# Patient Record
Sex: Male | Born: 1935 | Race: White | Hispanic: No | Marital: Married
Health system: Southern US, Community
[De-identification: ages and names within clinical notes are randomized; demographics above are authoritative.]

---

## 2005-02-02 ENCOUNTER — Ambulatory Visit: Payer: Self-pay | Admitting: Gastroenterology

## 2005-02-16 ENCOUNTER — Ambulatory Visit: Payer: Self-pay | Admitting: Internal Medicine

## 2005-03-06 ENCOUNTER — Ambulatory Visit: Payer: Self-pay | Admitting: Internal Medicine

## 2014-01-04 ENCOUNTER — Inpatient Hospital Stay: Payer: Self-pay | Admitting: Specialist

## 2014-01-04 LAB — CBC WITH DIFFERENTIAL/PLATELET
BASOS PCT: 0.6 %
Basophil #: 0.1 10*3/uL (ref 0.0–0.1)
EOS ABS: 0.9 10*3/uL — AB (ref 0.0–0.7)
Eosinophil %: 7 %
HCT: 48.9 % (ref 40.0–52.0)
HGB: 16.6 g/dL (ref 13.0–18.0)
LYMPHS PCT: 14.5 %
Lymphocyte #: 1.9 10*3/uL (ref 1.0–3.6)
MCH: 33.2 pg (ref 26.0–34.0)
MCHC: 34 g/dL (ref 32.0–36.0)
MCV: 98 fL (ref 80–100)
Monocyte #: 1.1 x10 3/mm — ABNORMAL HIGH (ref 0.2–1.0)
Monocyte %: 8.7 %
NEUTROS ABS: 9.1 10*3/uL — AB (ref 1.4–6.5)
Neutrophil %: 69.2 %
PLATELETS: 227 10*3/uL (ref 150–440)
RBC: 5 10*6/uL (ref 4.40–5.90)
RDW: 13.8 % (ref 11.5–14.5)
WBC: 13.1 10*3/uL — AB (ref 3.8–10.6)

## 2014-01-04 LAB — COMPREHENSIVE METABOLIC PANEL
ALK PHOS: 91 U/L
AST: 24 U/L (ref 15–37)
Albumin: 3.8 g/dL (ref 3.4–5.0)
Anion Gap: 5 — ABNORMAL LOW (ref 7–16)
BUN: 16 mg/dL (ref 7–18)
Bilirubin,Total: 0.5 mg/dL (ref 0.2–1.0)
CO2: 28 mmol/L (ref 21–32)
CREATININE: 0.97 mg/dL (ref 0.60–1.30)
Calcium, Total: 9 mg/dL (ref 8.5–10.1)
Chloride: 100 mmol/L (ref 98–107)
EGFR (African American): >60
EGFR (Non-African Amer.): >60
Glucose: 148 mg/dL — ABNORMAL HIGH (ref 65–99)
Osmolality: 270 (ref 275–301)
Potassium: 4.3 mmol/L (ref 3.5–5.1)
SGPT (ALT): 27 U/L (ref 12–78)
Sodium: 133 mmol/L — ABNORMAL LOW (ref 136–145)
Total Protein: 8.3 g/dL — ABNORMAL HIGH (ref 6.4–8.2)

## 2014-01-04 LAB — TROPONIN I

## 2014-01-05 LAB — CBC WITH DIFFERENTIAL/PLATELET
Basophil #: 0 10*3/uL (ref 0.0–0.1)
Basophil %: 0.1 %
Eosinophil #: 0 10*3/uL (ref 0.0–0.7)
Eosinophil %: 0.1 %
HCT: 46 % (ref 40.0–52.0)
HGB: 15.5 g/dL (ref 13.0–18.0)
Lymphocyte #: 0.5 10*3/uL — ABNORMAL LOW (ref 1.0–3.6)
Lymphocyte %: 6.7 %
MCH: 33 pg (ref 26.0–34.0)
MCHC: 33.8 g/dL (ref 32.0–36.0)
MCV: 98 fL (ref 80–100)
Monocyte #: 0.1 x10 3/mm — ABNORMAL LOW (ref 0.2–1.0)
Monocyte %: 0.7 %
Neutrophil #: 7 10*3/uL — ABNORMAL HIGH (ref 1.4–6.5)
Neutrophil %: 92.4 %
Platelet: 187 10*3/uL (ref 150–440)
RBC: 4.7 10*6/uL (ref 4.40–5.90)
RDW: 13.6 % (ref 11.5–14.5)
WBC: 7.6 10*3/uL (ref 3.8–10.6)

## 2014-02-01 ENCOUNTER — Observation Stay: Payer: Self-pay | Admitting: Internal Medicine

## 2014-02-01 LAB — BASIC METABOLIC PANEL
ANION GAP: 8 (ref 7–16)
BUN: 18 mg/dL (ref 7–18)
CHLORIDE: 101 mmol/L (ref 98–107)
CO2: 23 mmol/L (ref 21–32)
Calcium, Total: 8.9 mg/dL (ref 8.5–10.1)
Creatinine: 0.89 mg/dL (ref 0.60–1.30)
EGFR (African American): >60
EGFR (Non-African Amer.): >60
Glucose: 140 mg/dL — ABNORMAL HIGH (ref 65–99)
Osmolality: 269 (ref 275–301)
POTASSIUM: 4.3 mmol/L (ref 3.5–5.1)
SODIUM: 132 mmol/L — AB (ref 136–145)

## 2014-02-01 LAB — CBC
HCT: 47 % (ref 40.0–52.0)
HGB: 15.6 g/dL (ref 13.0–18.0)
MCH: 32 pg (ref 26.0–34.0)
MCHC: 33.2 g/dL (ref 32.0–36.0)
MCV: 97 fL (ref 80–100)
PLATELETS: 185 10*3/uL (ref 150–440)
RBC: 4.86 10*6/uL (ref 4.40–5.90)
RDW: 13.5 % (ref 11.5–14.5)
WBC: 7.2 10*3/uL (ref 3.8–10.6)

## 2014-02-01 LAB — TROPONIN I

## 2014-02-02 LAB — BASIC METABOLIC PANEL
ANION GAP: 7 (ref 7–16)
BUN: 19 mg/dL — ABNORMAL HIGH (ref 7–18)
CHLORIDE: 101 mmol/L (ref 98–107)
CO2: 26 mmol/L (ref 21–32)
Calcium, Total: 9.1 mg/dL (ref 8.5–10.1)
Creatinine: 1.04 mg/dL (ref 0.60–1.30)
EGFR (Non-African Amer.): >60
GLUCOSE: 172 mg/dL — AB (ref 65–99)
Osmolality: 275 (ref 275–301)
Potassium: 4.5 mmol/L (ref 3.5–5.1)
Sodium: 134 mmol/L — ABNORMAL LOW (ref 136–145)

## 2014-02-02 LAB — CBC WITH DIFFERENTIAL/PLATELET
Basophil #: 0 10*3/uL (ref 0.0–0.1)
Basophil %: 0.2 %
Eosinophil #: 0 10*3/uL (ref 0.0–0.7)
Eosinophil %: 0.2 %
HCT: 43.5 % (ref 40.0–52.0)
HGB: 14.5 g/dL (ref 13.0–18.0)
LYMPHS PCT: 13.3 %
Lymphocyte #: 0.5 10*3/uL — ABNORMAL LOW (ref 1.0–3.6)
MCH: 32.2 pg (ref 26.0–34.0)
MCHC: 33.5 g/dL (ref 32.0–36.0)
MCV: 96 fL (ref 80–100)
Monocyte #: 0.1 x10 3/mm — ABNORMAL LOW (ref 0.2–1.0)
Monocyte %: 1.9 %
NEUTROS ABS: 3.4 10*3/uL (ref 1.4–6.5)
Neutrophil %: 84.4 %
Platelet: 175 10*3/uL (ref 150–440)
RBC: 4.51 10*6/uL (ref 4.40–5.90)
RDW: 13.2 % (ref 11.5–14.5)
WBC: 4 10*3/uL (ref 3.8–10.6)

## 2014-03-01 ENCOUNTER — Inpatient Hospital Stay: Payer: Self-pay | Admitting: Internal Medicine

## 2014-03-01 LAB — CBC WITH DIFFERENTIAL/PLATELET
BASOS PCT: 0.7 %
Basophil #: 0.1 10*3/uL (ref 0.0–0.1)
EOS PCT: 10.1 %
Eosinophil #: 1.2 10*3/uL — ABNORMAL HIGH (ref 0.0–0.7)
HCT: 45.8 % (ref 40.0–52.0)
HGB: 15.7 g/dL (ref 13.0–18.0)
LYMPHS ABS: 2.2 10*3/uL (ref 1.0–3.6)
Lymphocyte %: 18.3 %
MCH: 33.1 pg (ref 26.0–34.0)
MCHC: 34.2 g/dL (ref 32.0–36.0)
MCV: 97 fL (ref 80–100)
MONOS PCT: 13.9 %
Monocyte #: 1.7 x10 3/mm — ABNORMAL HIGH (ref 0.2–1.0)
NEUTROS PCT: 57 %
Neutrophil #: 6.9 10*3/uL — ABNORMAL HIGH (ref 1.4–6.5)
Platelet: 212 10*3/uL (ref 150–440)
RBC: 4.74 10*6/uL (ref 4.40–5.90)
RDW: 13.5 % (ref 11.5–14.5)
WBC: 12.1 10*3/uL — AB (ref 3.8–10.6)

## 2014-03-01 LAB — BASIC METABOLIC PANEL
Anion Gap: 8 (ref 7–16)
BUN: 13 mg/dL (ref 7–18)
CALCIUM: 9.1 mg/dL (ref 8.5–10.1)
Chloride: 97 mmol/L — ABNORMAL LOW (ref 98–107)
Co2: 28 mmol/L (ref 21–32)
Creatinine: 0.91 mg/dL (ref 0.60–1.30)
EGFR (African American): >60
EGFR (Non-African Amer.): >60
Glucose: 138 mg/dL — ABNORMAL HIGH (ref 65–99)
Osmolality: 269 (ref 275–301)
POTASSIUM: 4.3 mmol/L (ref 3.5–5.1)
Sodium: 133 mmol/L — ABNORMAL LOW (ref 136–145)

## 2014-03-03 LAB — EXPECTORATED SPUTUM ASSESSMENT W GRAM STAIN, RFLX TO RESP C

## 2014-06-16 ENCOUNTER — Emergency Department: Payer: Self-pay | Admitting: Emergency Medicine

## 2014-06-16 LAB — COMPREHENSIVE METABOLIC PANEL
ALK PHOS: 84 U/L
Albumin: 4 g/dL (ref 3.4–5.0)
Anion Gap: 7 (ref 7–16)
BUN: 11 mg/dL (ref 7–18)
Bilirubin,Total: 0.5 mg/dL (ref 0.2–1.0)
CO2: 26 mmol/L (ref 21–32)
Calcium, Total: 8.8 mg/dL (ref 8.5–10.1)
Chloride: 103 mmol/L (ref 98–107)
Creatinine: 0.93 mg/dL (ref 0.60–1.30)
GLUCOSE: 136 mg/dL — AB (ref 65–99)
Osmolality: 273 (ref 275–301)
Potassium: 4.2 mmol/L (ref 3.5–5.1)
SGOT(AST): 23 U/L (ref 15–37)
SGPT (ALT): 28 U/L
Sodium: 136 mmol/L (ref 136–145)
Total Protein: 7.8 g/dL (ref 6.4–8.2)

## 2014-06-16 LAB — CBC
HCT: 48.1 % (ref 40.0–52.0)
HGB: 16.1 g/dL (ref 13.0–18.0)
MCH: 32.6 pg (ref 26.0–34.0)
MCHC: 33.4 g/dL (ref 32.0–36.0)
MCV: 97 fL (ref 80–100)
PLATELETS: 203 10*3/uL (ref 150–440)
RBC: 4.94 10*6/uL (ref 4.40–5.90)
RDW: 12.9 % (ref 11.5–14.5)
WBC: 9.5 10*3/uL (ref 3.8–10.6)

## 2014-06-16 LAB — TROPONIN I

## 2014-06-16 LAB — URINALYSIS, COMPLETE
BACTERIA: NONE SEEN
BLOOD: NEGATIVE
Bilirubin,UR: NEGATIVE
Glucose,UR: NEGATIVE mg/dL (ref 0–75)
Ketone: NEGATIVE
Leukocyte Esterase: NEGATIVE
NITRITE: NEGATIVE
Ph: 6 (ref 4.5–8.0)
Protein: 30
RBC,UR: 1 /HPF (ref 0–5)
SPECIFIC GRAVITY: 1.006 (ref 1.003–1.030)
WBC UR: <1 /HPF (ref 0–5)

## 2014-06-16 LAB — CK TOTAL AND CKMB (NOT AT ARMC)
CK, Total: 139 U/L
CK-MB: 3.2 ng/mL (ref 0.5–3.6)

## 2014-08-10 ENCOUNTER — Emergency Department: Payer: Self-pay | Admitting: Emergency Medicine

## 2014-08-25 ENCOUNTER — Ambulatory Visit: Payer: Self-pay | Admitting: Internal Medicine

## 2014-08-27 ENCOUNTER — Ambulatory Visit: Payer: Self-pay | Admitting: Internal Medicine

## 2014-09-08 ENCOUNTER — Inpatient Hospital Stay: Payer: Self-pay | Admitting: Internal Medicine

## 2014-09-08 LAB — COMPREHENSIVE METABOLIC PANEL
ANION GAP: 4 — AB (ref 7–16)
Albumin: 3.2 g/dL — ABNORMAL LOW (ref 3.4–5.0)
Alkaline Phosphatase: 86 U/L
BUN: 11 mg/dL (ref 7–18)
Bilirubin,Total: 0.6 mg/dL (ref 0.2–1.0)
CHLORIDE: 101 mmol/L (ref 98–107)
CO2: 31 mmol/L (ref 21–32)
Calcium, Total: 9 mg/dL (ref 8.5–10.1)
Creatinine: 0.89 mg/dL (ref 0.60–1.30)
EGFR (Non-African Amer.): >60
Glucose: 145 mg/dL — ABNORMAL HIGH (ref 65–99)
OSMOLALITY: 274 (ref 275–301)
Potassium: 4.1 mmol/L (ref 3.5–5.1)
SGOT(AST): 17 U/L (ref 15–37)
SGPT (ALT): 26 U/L
SODIUM: 136 mmol/L (ref 136–145)
Total Protein: 6.8 g/dL (ref 6.4–8.2)

## 2014-09-08 LAB — CBC WITH DIFFERENTIAL/PLATELET
BASOS ABS: 0 10*3/uL (ref 0.0–0.1)
Basophil %: 0.5 %
EOS ABS: 0.5 10*3/uL (ref 0.0–0.7)
Eosinophil %: 7.4 %
HCT: 41.2 % (ref 40.0–52.0)
HGB: 13.8 g/dL (ref 13.0–18.0)
LYMPHS PCT: 18.1 %
Lymphocyte #: 1.2 10*3/uL (ref 1.0–3.6)
MCH: 32.4 pg (ref 26.0–34.0)
MCHC: 33.5 g/dL (ref 32.0–36.0)
MCV: 97 fL (ref 80–100)
Monocyte #: 0.7 x10 3/mm (ref 0.2–1.0)
Monocyte %: 10.7 %
Neutrophil #: 4.3 10*3/uL (ref 1.4–6.5)
Neutrophil %: 63.3 %
PLATELETS: 199 10*3/uL (ref 150–440)
RBC: 4.25 10*6/uL — ABNORMAL LOW (ref 4.40–5.90)
RDW: 14.3 % (ref 11.5–14.5)
WBC: 6.7 10*3/uL (ref 3.8–10.6)

## 2014-09-08 LAB — TROPONIN I: Troponin-I: 0.03 ng/mL

## 2014-09-08 LAB — CK TOTAL AND CKMB (NOT AT ARMC)
CK, Total: 71 U/L (ref 39–308)
CK-MB: 2.2 ng/mL (ref 0.5–3.6)

## 2014-09-09 LAB — CBC WITH DIFFERENTIAL/PLATELET
BASOS ABS: 0 10*3/uL (ref 0.0–0.1)
Basophil %: 0.2 %
EOS PCT: 0.2 %
Eosinophil #: 0 10*3/uL (ref 0.0–0.7)
HCT: 38.3 % — ABNORMAL LOW (ref 40.0–52.0)
HGB: 13.1 g/dL (ref 13.0–18.0)
Lymphocyte #: 0.5 10*3/uL — ABNORMAL LOW (ref 1.0–3.6)
Lymphocyte %: 15 %
MCH: 32.6 pg (ref 26.0–34.0)
MCHC: 34.2 g/dL (ref 32.0–36.0)
MCV: 95 fL (ref 80–100)
MONO ABS: 0.1 x10 3/mm — AB (ref 0.2–1.0)
Monocyte %: 2.6 %
NEUTROS PCT: 82 %
Neutrophil #: 2.7 10*3/uL (ref 1.4–6.5)
PLATELETS: 198 10*3/uL (ref 150–440)
RBC: 4.03 10*6/uL — ABNORMAL LOW (ref 4.40–5.90)
RDW: 14 % (ref 11.5–14.5)
WBC: 3.2 10*3/uL — AB (ref 3.8–10.6)

## 2014-09-09 LAB — BASIC METABOLIC PANEL
Anion Gap: 7 (ref 7–16)
BUN: 12 mg/dL (ref 7–18)
CALCIUM: 9 mg/dL (ref 8.5–10.1)
CHLORIDE: 99 mmol/L (ref 98–107)
Co2: 27 mmol/L (ref 21–32)
Creatinine: 0.86 mg/dL (ref 0.60–1.30)
EGFR (African American): >60
EGFR (Non-African Amer.): >60
Glucose: 194 mg/dL — ABNORMAL HIGH (ref 65–99)
OSMOLALITY: 271 (ref 275–301)
Potassium: 4.4 mmol/L (ref 3.5–5.1)
Sodium: 133 mmol/L — ABNORMAL LOW (ref 136–145)

## 2014-09-12 LAB — EXPECTORATED SPUTUM ASSESSMENT W REFEX TO RESP CULTURE

## 2014-09-12 LAB — CREATININE, SERUM
Creatinine: 1 mg/dL (ref 0.60–1.30)
EGFR (African American): >60
EGFR (Non-African Amer.): >60

## 2014-09-13 LAB — CULTURE, BLOOD (SINGLE)

## 2014-09-13 LAB — PLATELET COUNT: Platelet: 236 10*3/uL (ref 150–440)

## 2014-09-14 LAB — CBC WITH DIFFERENTIAL/PLATELET
BASOS PCT: 0.1 %
Basophil #: 0 10*3/uL (ref 0.0–0.1)
EOS PCT: 0.4 %
Eosinophil #: 0 10*3/uL (ref 0.0–0.7)
HCT: 36.5 % — AB (ref 40.0–52.0)
HGB: 12.8 g/dL — AB (ref 13.0–18.0)
Lymphocyte #: 0.9 10*3/uL — ABNORMAL LOW (ref 1.0–3.6)
Lymphocyte %: 12.7 %
MCH: 33.2 pg (ref 26.0–34.0)
MCHC: 35 g/dL (ref 32.0–36.0)
MCV: 95 fL (ref 80–100)
MONO ABS: 0.8 x10 3/mm (ref 0.2–1.0)
Monocyte %: 10.9 %
NEUTROS ABS: 5.6 10*3/uL (ref 1.4–6.5)
Neutrophil %: 75.9 %
Platelet: 237 10*3/uL (ref 150–440)
RBC: 3.85 10*6/uL — ABNORMAL LOW (ref 4.40–5.90)
RDW: 14.1 % (ref 11.5–14.5)
WBC: 7.3 10*3/uL (ref 3.8–10.6)

## 2014-09-14 LAB — BASIC METABOLIC PANEL
ANION GAP: 7 (ref 7–16)
BUN: 22 mg/dL — AB (ref 7–18)
CALCIUM: 8.3 mg/dL — AB (ref 8.5–10.1)
Chloride: 97 mmol/L — ABNORMAL LOW (ref 98–107)
Co2: 29 mmol/L (ref 21–32)
Creatinine: 1.01 mg/dL (ref 0.60–1.30)
EGFR (African American): >60
GLUCOSE: 163 mg/dL — AB (ref 65–99)
OSMOLALITY: 273 (ref 275–301)
POTASSIUM: 4.4 mmol/L (ref 3.5–5.1)
Sodium: 133 mmol/L — ABNORMAL LOW (ref 136–145)

## 2014-09-16 ENCOUNTER — Ambulatory Visit: Payer: Self-pay | Admitting: Internal Medicine

## 2014-09-19 LAB — BRONCHIAL WASH CULTURE

## 2014-09-21 ENCOUNTER — Ambulatory Visit: Payer: Self-pay | Admitting: Internal Medicine

## 2014-09-30 LAB — CULTURE, FUNGUS WITHOUT SMEAR

## 2014-10-26 ENCOUNTER — Ambulatory Visit: Payer: Self-pay | Admitting: Internal Medicine

## 2014-10-27 ENCOUNTER — Ambulatory Visit: Admit: 2014-10-27 | Disposition: A | Discharge status: Home / Self Care | Payer: Self-pay | Admitting: Internal Medicine

## 2014-11-11 ENCOUNTER — Emergency Department: Payer: Self-pay | Admitting: Emergency Medicine

## 2014-11-18 ENCOUNTER — Ambulatory Visit: Payer: Self-pay | Admitting: Internal Medicine

## 2014-11-25 ENCOUNTER — Inpatient Hospital Stay
Admit: 2014-11-25 | Disposition: A | Discharge status: Hospice | Payer: Self-pay | Attending: Internal Medicine | Admitting: Internal Medicine

## 2014-11-25 LAB — PHOSPHORUS: Phosphorus: 3.6 mg/dL

## 2014-11-25 LAB — HEPATIC FUNCTION PANEL A (ARMC)
ALBUMIN: 2.2 g/dL — AB
Alkaline Phosphatase: 146 U/L — ABNORMAL HIGH
BILIRUBIN INDIRECT: 0.2
Bilirubin, Direct: 0.2 mg/dL
Bilirubin,Total: 0.4 mg/dL
SGOT(AST): 29 U/L
SGPT (ALT): 28 U/L
TOTAL PROTEIN: 6.1 g/dL — AB

## 2014-11-25 LAB — CK-MB
CK-MB: 14 ng/mL — AB
CK-MB: 3.8 ng/mL

## 2014-11-25 LAB — CBC
HCT: 38.2 % — ABNORMAL LOW (ref 40.0–52.0)
HGB: 12.3 g/dL — AB (ref 13.0–18.0)
MCH: 31.7 pg (ref 26.0–34.0)
MCHC: 32.3 g/dL (ref 32.0–36.0)
MCV: 98 fL (ref 80–100)
Platelet: 223 10*3/uL (ref 150–440)
RBC: 3.89 10*6/uL — ABNORMAL LOW (ref 4.40–5.90)
RDW: 15.6 % — ABNORMAL HIGH (ref 11.5–14.5)
WBC: 33.1 10*3/uL — ABNORMAL HIGH (ref 3.8–10.6)

## 2014-11-25 LAB — BASIC METABOLIC PANEL
Anion Gap: 11 (ref 7–16)
BUN: 53 mg/dL — ABNORMAL HIGH
CALCIUM: 9.7 mg/dL
CO2: 25 mmol/L
CREATININE: 1.26 mg/dL — AB
Chloride: 100 mmol/L — ABNORMAL LOW
EGFR (African American): >60
EGFR (Non-African Amer.): 54 — ABNORMAL LOW
Glucose: 172 mg/dL — ABNORMAL HIGH
Potassium: 4.9 mmol/L
Sodium: 136 mmol/L

## 2014-11-25 LAB — MAGNESIUM: MAGNESIUM: 2.1 mg/dL

## 2014-11-25 LAB — PROTIME-INR
INR: 1.2
PROTHROMBIN TIME: 15.2 s — AB

## 2014-11-25 LAB — RAPID HIV SCREEN (HIV 1/2 AB+AG)

## 2014-11-25 LAB — TROPONIN I: Troponin-I: 0.6 ng/mL — ABNORMAL HIGH

## 2014-11-25 LAB — LACTIC ACID, PLASMA: LACTIC ACID, VENOUS: 2.3 mmol/L — AB

## 2014-11-26 LAB — URINALYSIS, COMPLETE
BILIRUBIN, UR: NEGATIVE
Bacteria: NONE SEEN
Blood: NEGATIVE
Glucose,UR: 50 mg/dL (ref 0–75)
Hyaline Cast: 3
KETONE: NEGATIVE
Leukocyte Esterase: NEGATIVE
Nitrite: NEGATIVE
Ph: 5 (ref 4.5–8.0)
Protein: NEGATIVE
Specific Gravity: 1.018 (ref 1.003–1.030)

## 2014-11-26 LAB — COMPREHENSIVE METABOLIC PANEL
ALBUMIN: 2 g/dL — AB
ANION GAP: 6 — AB (ref 7–16)
Alkaline Phosphatase: 141 U/L — ABNORMAL HIGH
BILIRUBIN TOTAL: 0.7 mg/dL
BUN: 51 mg/dL — ABNORMAL HIGH
CREATININE: 1.2 mg/dL
Calcium, Total: 9 mg/dL
Chloride: 99 mmol/L — ABNORMAL LOW
Co2: 26 mmol/L
EGFR (African American): >60
EGFR (Non-African Amer.): 57 — ABNORMAL LOW
Glucose: 280 mg/dL — ABNORMAL HIGH
POTASSIUM: 5.2 mmol/L — AB
SGOT(AST): 36 U/L
SGPT (ALT): 30 U/L
Sodium: 131 mmol/L — ABNORMAL LOW
Total Protein: 5.7 g/dL — ABNORMAL LOW

## 2014-11-26 LAB — CBC WITH DIFFERENTIAL/PLATELET
BASOS ABS: 0.1 10*3/uL (ref 0.0–0.1)
Basophil %: 0.2 %
EOS ABS: 0 10*3/uL (ref 0.0–0.7)
Eosinophil %: 0.1 %
HCT: 37.1 % — ABNORMAL LOW (ref 40.0–52.0)
HGB: 12 g/dL — ABNORMAL LOW (ref 13.0–18.0)
Lymphocyte #: 0.2 10*3/uL — ABNORMAL LOW (ref 1.0–3.6)
Lymphocyte %: 0.7 %
MCH: 31.9 pg (ref 26.0–34.0)
MCHC: 32.3 g/dL (ref 32.0–36.0)
MCV: 99 fL (ref 80–100)
Monocyte #: 0.1 x10 3/mm — ABNORMAL LOW (ref 0.2–1.0)
Monocyte %: 0.3 %
NEUTROS PCT: 98.7 %
Neutrophil #: 29.1 10*3/uL — ABNORMAL HIGH (ref 1.4–6.5)
Platelet: 182 10*3/uL (ref 150–440)
RBC: 3.76 10*6/uL — ABNORMAL LOW (ref 4.40–5.90)
RDW: 15.9 % — AB (ref 11.5–14.5)
WBC: 29.5 10*3/uL — ABNORMAL HIGH (ref 3.8–10.6)

## 2014-11-26 LAB — HEMOGLOBIN A1C: Hemoglobin A1C: 7.4 % — ABNORMAL HIGH

## 2014-11-26 LAB — LACTIC ACID, PLASMA: Lactic Acid, Venous: 2.2 mmol/L

## 2014-11-26 LAB — CK-MB: CK-MB: 4 ng/mL

## 2014-11-27 LAB — COMPREHENSIVE METABOLIC PANEL
ALBUMIN: 1.8 g/dL — AB
ALT: 35 U/L
ANION GAP: 8 (ref 7–16)
Alkaline Phosphatase: 108 U/L
BILIRUBIN TOTAL: 0.8 mg/dL
BUN: 51 mg/dL — AB
CHLORIDE: 97 mmol/L — AB
Calcium, Total: 9.2 mg/dL
Co2: 28 mmol/L
Creatinine: 1.07 mg/dL
EGFR (African American): >60
EGFR (Non-African Amer.): >60
GLUCOSE: 199 mg/dL — AB
Potassium: 4.5 mmol/L
SGOT(AST): 46 U/L — ABNORMAL HIGH
SODIUM: 133 mmol/L — AB
Total Protein: 5.5 g/dL — ABNORMAL LOW

## 2014-11-27 LAB — CBC WITH DIFFERENTIAL/PLATELET
BASOS ABS: 0.1 10*3/uL (ref 0.0–0.1)
Basophil %: 0.2 %
Eosinophil #: 0 10*3/uL (ref 0.0–0.7)
Eosinophil %: 0 %
HCT: 29.9 % — ABNORMAL LOW (ref 40.0–52.0)
HGB: 9.7 g/dL — ABNORMAL LOW (ref 13.0–18.0)
LYMPHS ABS: 0.4 10*3/uL — AB (ref 1.0–3.6)
Lymphocyte %: 1.5 %
MCH: 31.7 pg (ref 26.0–34.0)
MCHC: 32.4 g/dL (ref 32.0–36.0)
MCV: 98 fL (ref 80–100)
MONO ABS: 0.6 x10 3/mm (ref 0.2–1.0)
Monocyte %: 1.9 %
Neutrophil #: 28.6 10*3/uL — ABNORMAL HIGH (ref 1.4–6.5)
Neutrophil %: 96.4 %
PLATELETS: 191 10*3/uL (ref 150–440)
RBC: 3.06 10*6/uL — ABNORMAL LOW (ref 4.40–5.90)
RDW: 15.6 % — ABNORMAL HIGH (ref 11.5–14.5)
WBC: 29.7 10*3/uL — ABNORMAL HIGH (ref 3.8–10.6)

## 2014-11-27 LAB — PRO B NATRIURETIC PEPTIDE: B-Type Natriuretic Peptide: 996 pg/mL — ABNORMAL HIGH

## 2014-11-28 LAB — BASIC METABOLIC PANEL
Anion Gap: 6 — ABNORMAL LOW (ref 7–16)
BUN: 48 mg/dL — ABNORMAL HIGH
CREATININE: 0.94 mg/dL
Calcium, Total: 8.7 mg/dL — ABNORMAL LOW
Chloride: 95 mmol/L — ABNORMAL LOW
Co2: 32 mmol/L
EGFR (African American): >60
Glucose: 223 mg/dL — ABNORMAL HIGH
Potassium: 4.2 mmol/L
Sodium: 133 mmol/L — ABNORMAL LOW

## 2014-11-28 LAB — CBC WITH DIFFERENTIAL/PLATELET
BASOS PCT: 0.2 %
Basophil #: 0 10*3/uL (ref 0.0–0.1)
EOS PCT: 0 %
Eosinophil #: 0 10*3/uL (ref 0.0–0.7)
HCT: 35.3 % — ABNORMAL LOW (ref 40.0–52.0)
HGB: 11.4 g/dL — ABNORMAL LOW (ref 13.0–18.0)
LYMPHS ABS: 0.3 10*3/uL — AB (ref 1.0–3.6)
Lymphocyte %: 1.3 %
MCH: 31.5 pg (ref 26.0–34.0)
MCHC: 32.3 g/dL (ref 32.0–36.0)
MCV: 97 fL (ref 80–100)
MONO ABS: 0.4 x10 3/mm (ref 0.2–1.0)
MONOS PCT: 2.3 %
Neutrophil #: 18.2 10*3/uL — ABNORMAL HIGH (ref 1.4–6.5)
Neutrophil %: 96.2 %
PLATELETS: 144 10*3/uL — AB (ref 150–440)
RBC: 3.62 10*6/uL — AB (ref 4.40–5.90)
RDW: 15.6 % — ABNORMAL HIGH (ref 11.5–14.5)
WBC: 19 10*3/uL — ABNORMAL HIGH (ref 3.8–10.6)

## 2014-11-28 LAB — VANCOMYCIN, TROUGH: Vancomycin, Trough: 12 ug/mL

## 2014-11-29 LAB — CBC WITH DIFFERENTIAL/PLATELET
Basophil #: 0.1 10*3/uL (ref 0.0–0.1)
Basophil %: 0.4 %
EOS ABS: 0 10*3/uL (ref 0.0–0.7)
Eosinophil %: 0.1 %
HCT: 35.2 % — ABNORMAL LOW (ref 40.0–52.0)
HGB: 11.4 g/dL — AB (ref 13.0–18.0)
Lymphocyte #: 0.4 10*3/uL — ABNORMAL LOW (ref 1.0–3.6)
Lymphocyte %: 2.2 %
MCH: 31.4 pg (ref 26.0–34.0)
MCHC: 32.4 g/dL (ref 32.0–36.0)
MCV: 97 fL (ref 80–100)
Monocyte #: 0.6 x10 3/mm (ref 0.2–1.0)
Monocyte %: 3.1 %
NEUTROS PCT: 94.2 %
Neutrophil #: 16.9 10*3/uL — ABNORMAL HIGH (ref 1.4–6.5)
Platelet: 153 10*3/uL (ref 150–440)
RBC: 3.63 10*6/uL — ABNORMAL LOW (ref 4.40–5.90)
RDW: 15.3 % — ABNORMAL HIGH (ref 11.5–14.5)
WBC: 17.9 10*3/uL — ABNORMAL HIGH (ref 3.8–10.6)

## 2014-11-29 LAB — BASIC METABOLIC PANEL
Anion Gap: 5 — ABNORMAL LOW (ref 7–16)
BUN: 37 mg/dL — AB
CHLORIDE: 95 mmol/L — AB
Calcium, Total: 8.5 mg/dL — ABNORMAL LOW
Co2: 35 mmol/L — ABNORMAL HIGH
Creatinine: 0.7 mg/dL
EGFR (African American): >60
EGFR (Non-African Amer.): >60
Glucose: 257 mg/dL — ABNORMAL HIGH
Potassium: 4.6 mmol/L
Sodium: 135 mmol/L

## 2014-11-29 LAB — EXPECTORATED SPUTUM ASSESSMENT W REFEX TO RESP CULTURE

## 2014-11-30 LAB — CBC WITH DIFFERENTIAL/PLATELET
Basophil #: 0 10*3/uL (ref 0.0–0.1)
Basophil %: 0.1 %
EOS ABS: 0 10*3/uL (ref 0.0–0.7)
Eosinophil %: 0 %
HCT: 35.3 % — AB (ref 40.0–52.0)
HGB: 11.3 g/dL — ABNORMAL LOW (ref 13.0–18.0)
LYMPHS ABS: 0.5 10*3/uL — AB (ref 1.0–3.6)
Lymphocyte %: 2.8 %
MCH: 31 pg (ref 26.0–34.0)
MCHC: 32 g/dL (ref 32.0–36.0)
MCV: 97 fL (ref 80–100)
MONO ABS: 0.4 x10 3/mm (ref 0.2–1.0)
Monocyte %: 2.6 %
NEUTROS ABS: 15.7 10*3/uL — AB (ref 1.4–6.5)
Neutrophil %: 94.5 %
Platelet: 180 10*3/uL (ref 150–440)
RBC: 3.64 10*6/uL — ABNORMAL LOW (ref 4.40–5.90)
RDW: 15.5 % — AB (ref 11.5–14.5)
WBC: 16.6 10*3/uL — ABNORMAL HIGH (ref 3.8–10.6)

## 2014-11-30 LAB — BASIC METABOLIC PANEL
Anion Gap: 6 — ABNORMAL LOW (ref 7–16)
BUN: 33 mg/dL — ABNORMAL HIGH
CO2: 35 mmol/L — AB
Calcium, Total: 8.3 mg/dL — ABNORMAL LOW
Chloride: 94 mmol/L — ABNORMAL LOW
Creatinine: 0.64 mg/dL
Glucose: 166 mg/dL — ABNORMAL HIGH
Potassium: 4.3 mmol/L
Sodium: 135 mmol/L

## 2014-11-30 LAB — VANCOMYCIN, TROUGH: Vancomycin, Trough: 17 ug/mL

## 2014-11-30 LAB — CULTURE, BLOOD (SINGLE)

## 2014-12-01 LAB — BASIC METABOLIC PANEL
Anion Gap: 5 — ABNORMAL LOW (ref 7–16)
BUN: 37 mg/dL — AB
CALCIUM: 8.1 mg/dL — AB
Chloride: 94 mmol/L — ABNORMAL LOW
Co2: 36 mmol/L — ABNORMAL HIGH
Creatinine: 0.66 mg/dL
EGFR (African American): >60
EGFR (Non-African Amer.): >60
Glucose: 151 mg/dL — ABNORMAL HIGH
Potassium: 4.1 mmol/L
Sodium: 135 mmol/L

## 2014-12-01 LAB — CBC WITH DIFFERENTIAL/PLATELET
BASOS ABS: 0 10*3/uL (ref 0.0–0.1)
Basophil %: 0.1 %
EOS PCT: 0 %
Eosinophil #: 0 10*3/uL (ref 0.0–0.7)
HCT: 33.2 % — ABNORMAL LOW (ref 40.0–52.0)
HGB: 10.7 g/dL — ABNORMAL LOW (ref 13.0–18.0)
LYMPHS PCT: 3.3 %
Lymphocyte #: 0.5 10*3/uL — ABNORMAL LOW (ref 1.0–3.6)
MCH: 30.9 pg (ref 26.0–34.0)
MCHC: 32.1 g/dL (ref 32.0–36.0)
MCV: 96 fL (ref 80–100)
MONO ABS: 0.6 x10 3/mm (ref 0.2–1.0)
MONOS PCT: 3.7 %
Neutrophil #: 14.1 10*3/uL — ABNORMAL HIGH (ref 1.4–6.5)
Neutrophil %: 92.9 %
PLATELETS: 235 10*3/uL (ref 150–440)
RBC: 3.45 10*6/uL — AB (ref 4.40–5.90)
RDW: 15.1 % — ABNORMAL HIGH (ref 11.5–14.5)
WBC: 15.2 10*3/uL — AB (ref 3.8–10.6)

## 2014-12-19 NOTE — Discharge Summary (Signed)
Dates of Admission and Diagnosis:  Date of Admission 01-Mar-2014   Date of Discharge 02-Mar-2014   Admitting Diagnosis copd   Final Diagnosis 1. Acute on chronic resp failure 2. COPD exacerbation 3. Tobacco abuse    Chief Complaint/History of Present Illness CHIEF COMPLAINT:  Shortness of breath and chest tightness.   HISTORY OF PRESENT ILLNESS:  Devon Jones is a 81 year old Caucasian gentleman who was just admitted on June 7th, discharged June 9th  with similar symptoms of chronic obstructive pulmonary disease exacerbation, comes in after he has finished his course of antibiotic with increasing shortness of breath, cough with clear phlegm, occasionally yellow phlegm, no fever to the Emergency Room. He was found to be in significant respiratory failure with chronic obstructive pulmonary disease exacerbation and placed on BiPAP. He is satting 100%. He used BiPAP for about 30 to 45 minutes and was feeling better, wanted to get off of it. He uses normally 2 liters of home oxygen and SVNs, follows up with Dr. Raul Del. The patient is being admitted for further evaluation and management. Chest x-ray shows no pneumonia.   Allergies:  No Known Allergies:   Routine Micro:  05-Jul-15 16:45   Organism Name PSEUDOMONAS AERUGINOSA  Organism Quantity LIGHT GROWTH  Ciprofloxacin Sensitivity S  Gentamicin Sensitivity S  Ceftazidime Sensitivity S  Imipenem Sensitivity S  Levofloxacin Sensitivity S  Micro Text Report SPUTUM CULTURE   ORGANISM 1                LIGHT GROWTH PSEUDOMONAS AERUGINOSA   GRAM STAIN                POOR SPECIMEN-LESS THAN 70% WBC   GRAM STAIN                RARE WHITE BLOOD CELLS   GRAM STAIN                RARE GRAM NEGATIVE ROD RARE GRAM VARIABLE ROD   GRAM STAIN                MODERATE GRAM POSITIVE COCCI IN PAIRS IN CLUSTERS   ANTIBIOTIC                    ORG#1    ORG#2     CEFTAZIDIME                   S                  CIPROFLOXACIN                 S                   GENTAMICIN                    S                  IMIPENEM                      S                  LEVOFLOXACIN                  S  Organism 1 LIGHT GROWTH PSEUDOMONAS AERUGINOSA  Culture Comment ID TO FOLLOW SENSITIVITIES TO FOLLOW  Gram Stain 1 POOR SPECIMEN-LESS THAN 70% WBC  Gram Stain 2 RARE WHITE BLOOD CELLS  Gram Stain  3 RARE GRAM NEGATIVE ROD RARE GRAM VARIABLE ROD  Gram Stain 4 MODERATE GRAM POSITIVE COCCI IN PAIRS IN CLUSTERS  Result(s) reported on 03 Mar 2014 at 12:32PM.  Cardiology:  05-Jul-15 14:36   Ventricular Rate 101  Atrial Rate 101  P-R Interval 144  QRS Duration 82  QT 344  QTc 446  P Axis 79  R Axis 70  T Axis 82  ECG interpretation Sinus tachycardia with occasional Premature ventricular complexes Nonspecific ST abnormality Abnormal ECG When compared with ECG of 01-Feb-2014 17:23, Premature ventricular complexes are now Present ----------unconfirmed---------- Confirmed by OVERREAD, NOT ([id number]), editor PEARSON, BARBARA ([id number]) on 03/02/2014 8:14:32 AM  Routine Chem:  05-Jul-15 14:29   Glucose, Serum  138  BUN 13  Creatinine (comp) 0.91  Sodium, Serum  133  Potassium, Serum 4.3  Chloride, Serum  97  CO2, Serum 28  Calcium (Total), Serum 9.1  Anion Gap 8  Osmolality (calc) 269  eGFR (African American) >60  eGFR (Non-African American) >60 (eGFR values <73m/min/1.73 m2 may be an indication of chronic kidney disease (CKD). Calculated eGFR is useful in patients with stable renal function. The eGFR calculation will not be reliable in acutely ill patients when serum creatinine is changing rapidly. It is not useful in  patients on dialysis. The eGFR calculation may not be applicable to patients at the low and high extremes of body sizes, pregnant women, and vegetarians.)  Routine Hem:  05-Jul-15 14:29   WBC (CBC)  12.1  RBC (CBC) 4.74  Hemoglobin (CBC) 15.7  Hematocrit (CBC) 45.8  Platelet Count (CBC) 212  MCV 97  MCH 33.1  MCHC 34.2  RDW 13.5   Neutrophil % 57.0  Lymphocyte % 18.3  Monocyte % 13.9  Eosinophil % 10.1  Basophil % 0.7  Neutrophil #  6.9  Lymphocyte # 2.2  Monocyte #  1.7  Eosinophil #  1.2  Basophil # 0.1 (Result(s) reported on 01 Mar 2014 at 02:57PM.)   PERTINENT RADIOLOGY STUDIES: XRay:    07-Jun-15 14:04, Chest PA and Lateral  Chest PA and Lateral   REASON FOR EXAM:    SOB  COMMENTS:       PROCEDURE: DXR - DXR CHEST PA (OR AP) AND LATERAL  - Feb 01 2014  2:04PM     CLINICAL DATA:  Shortness of breath.    EXAM:  CHEST  2 VIEW    COMPARISON:  01/04/2014    FINDINGS:  Lungs are mildly hyperexpandedwith flattening of the  hemidiaphragms. There is no definite focal consolidation or  effusion. The cardiomediastinal silhouette is within normal. There  is minimal calcified plaque over the aortic arch. There are mild  degenerative changes of the spine.     IMPRESSION:  No acute cardiopulmonary disease.    COPD.      Electronically Signed    By: DMarin OlpM.D.    On: 02/01/2014 14:22       Verified By: DPearletha Alfred M.D.,    05-Jul-15 14:57, Chest Portable Single View  Chest Portable Single View   REASON FOR EXAM:    sob, wheeze  COMMENTS:       PROCEDURE: DXR - DXR PORTABLE CHEST SINGLE VIEW  - Mar 01 2014  2:57PM     CLINICAL DATA:  Shortness of breath    EXAM:  PORTABLE CHEST - 1 VIEW    COMPARISON:  02/01/2014    FINDINGS:  Cardiac shadow is stable. The lungs remain hyperinflated. No focal  infiltrate or sizable effusion is seen. Diffuse interstitial changes  are noted. No acute bony abnormality is seen.     IMPRESSION:  COPD without acute abnormality.      Electronically Signed    HV:FMBB  Lukens M.D.    On: 03/01/2014 14:57         Verified By: Everlene Farrier, M.D.,   Pertinent Past History:  Pertinent Past History PAST MEDICAL HISTORY:  1.  Chronic obstructive pulmonary disease, chronic with chronic respiratory failure, 2 liters nasal cannula oxygen at  home.  2.  Chronic smoker, however, states he quit about a month ago.   Hospital Course:  Hospital Course Admitted on to medical floor and started on IV steroids, Nebs, Abx. He improved well. Still had weezing on exam on day of discharge with no SOB. No edema S1, S2 Pt wanted to leave AMA but i gave him the prescritions and discharged him home.  time spent on discharge 40 min   Condition on Discharge Fair   Code Status:  Code Status Full Code   DISCHARGE INSTRUCTIONS HOME MEDS:  Medication Reconciliation: Patient's Home Medications at Discharge:     Medication Instructions  proair hfa cfc free 90 mcg/inh inhalation aerosol  2 puff(s) inhaled 4 times a day, As Needed - for Shortness of Breath   fluticasone-salmeterol 250 mcg-50 mcg inhalation powder  1 puff(s) inhaled 2 times a day   albuterol 2.5 mg/3 ml (0.083%) inhalation solution  1 vial (3 milliliter(s) vianebulizer every 6 hours as needed for shortness of breath/wheezing    spiriva 18 mcg inhalation capsule  1 cap(s) via handihaler once a day   levofloxacin 250 mg oral tablet  1 tab(s) orally once a day   prednisone 10 mg oral tablet  Start 60 mg and taper by 10 mg every 2 days till complete   flonase 50 mcg/inh nasal spray  1 spray(s) nasal 2 times a day     Physician's Instructions:  Home Health? No   Home Oxygen? Yes   Oxygen delivery at home: 2L   Diet Regular   Activity Limitations As tolerated   Return to Work Not Applicable   Time frame for Follow Up Appointment 1-2 weeks  PCP   Other Comments QUIT SMOKING   Electronic Signatures: Deundra Bard, Lottie Dawson (MD)  (Signed 08-Jul-15 19:44)  Authored: ADMISSION DATE AND DIAGNOSIS, CHIEF COMPLAINT/HPI, Allergies, PERTINENT LABS, PERTINENT RADIOLOGY STUDIES, [place], PATIENT INSTRUCTIONS   Last Updated: 08-Jul-15 19:44 by Alba Destine (MD)

## 2014-12-19 NOTE — H&P (Signed)
PATIENT NAME:  Devon Jones, Devon Jones MR#:  [id number] DATE OF BIRTH:  [birth year]  DATE OF ADMISSION:  02/01/2014  REASON FOR ADMISSION: COPD exacerbation.   REFERRING PHYSICIAN: Dr. Sharyn Creamer  PRIMARY CARE PHYSICIAN: Dr. Mickey Farber   HISTORY OF PRESENT ILLNESS: This is a pleasant, 81 year old gentleman with history of COPD and chronic respiratory failure, who has been on oxygen, 2 liters nasal cannula, for several years. The patient was recently admitted here on 01/04/2014 within a month ago, and stayed in the hospital for 3 days, was discharged home and is starting to have some improvement. During the past 2 to 3 days, he was starting to complain of worsening cough with increased sputum and wheezing, significant respiratory problems with increased work of breathing, shortness of breath whenever he walks 5 to 10 steps. The patient states that overall he is just feeling worse today, getting more short of breath and not able to function, for what he comes to the Emergency Department. In the Emergency Department he is overall stable, but very tight, sounds significantly congested. His oxygen saturation was 92% on 2 liters of oxygen. His heart rate was up to 91, 92 and he has been afebrile. The patient is admitted for evaluation and treatment of his COPD. The patient states that he has been taking Spiriva and Flovent on a regular basis. The patient says that he quit smoking about a month ago, although he still smells heavily of cigarettes.   REVIEW OF SYSTEMS:  A 12-system review of systems is done.  CONSTITUTIONAL: No fever, fatigue, weakness, weight loss or weight gain. No chills.  EYES: No blurry vision, double vision, or loss of eyesight.  EARS, NOSE, THROAT: No difficulty hearing, no difficulty swallowing. Positive snoring.  RESPIRATORY: Positive cough. Increased sputum. Positive wheezing. Positive COPD. The patient states that he quit smoking a month ago. No use of accessory muscles at this moment. No  hemoptysis.  CARDIOVASCULAR: No chest pain, orthopnea, or edema.  GASTROINTESTINAL: No nausea, vomiting, abdominal pain, constipation, diarrhea.  GENITOURINARY: No dysuria, hematuria, changes in frequency.  ENDOCRINE: No polyuria, polydipsia, polyphagia. No cold or heat intolerance.  SKIN: No rashes, petechiae, or new lesions.  MUSCULOSKELETAL: No significant neck pain, back pain or gout.  NEUROLOGIC: No numbness, tingling. No CVA or TIA in the past.  PSYCHIATRIC: No agitation.  HEMATOLOGIC AND LYMPHATIC: No anemia, easy bruising or bleeding.   PAST MEDICAL HISTORY: 1.  Chronic respiratory failure.  2.  COPD.   3.  Chronic smoker, states that he quit one month ago.   ALLERGIES: No known drug allergies.   CURRENT MEDICATIONS: Advair Diskus 250/50 twice daily, Spiriva 18 mcg daily, DuoNeb.   SOCIAL HISTORY: The patient was a smoker of 1 pack a day. The patient states that he quit a month ago but, again, he smells heavily of smoke today. He denies any drug or alcohol use. He lives with his wife.   PAST SURGICAL HISTORY: Rotator cuff repair, and he had some polyps in his throat that were removed several years ago.   FAMILY HISTORY: Positive for congestive heart failure in both parents.   PHYSICAL EXAMINATION: VITAL SIGNS: Blood pressure of 143/87, respirations 22, temperature 97.6, pulse 87, up to 96.  GENERAL: The patient is alert, oriented x 3, no acute distress. No respiratory distress. Hemodynamically stable. No significant use of accessory muscles.  EARS, NOSE, THROAT: The patient is atraumatic, normocephalic. Pupils are equal and reactive. Extraocular movements are intact. Mucosa is moist. Anicteric sclerae. Pink  conjunctivae. No oral lesions. No oropharyngeal exudates.  NECK: Supple. No JVD. No thyromegaly. No adenopathy. No carotid bruits.  CARDIOVASCULAR: Regular rate and rhythm. No murmurs, rubs or gallops. No displacement of PMI. No tenderness to palpation of anterior chest  wall.  LUNGS: Showing decreased respiratory effort, decreased air entrance, with significant wheezing and extension of the expiratory phase bilaterally. Not much air movement at this moment. No crackles. No dullness to percussion. No use of accessory muscles.  ABDOMEN: Soft, nontender, nondistended. No hepatosplenomegaly. No masses. Bowel sounds are positive.  EXTREMITIES: No edema, cyanosis or clubbing. Pulses +2. Capillary refill less than 3.  NEUROLOGIC: Cranial nerves II to XII intact. No focal findings. Strength is equal in all 4 extremities.  SKIN: No rashes or petechiae.  MUSCULOSKELETAL: No significant joint effusions or joint swelling.  LYMPHATIC: Negative for lymphadenopathy in the neck and supraclavicular areas.   DATA:  Chest x-ray: No acute findings. No signs of pneumonia or consolidation. Lab work is overall unremarkable. His sodium is slightly decreased to 132, glucose is 140, creatinine 0.89. His white count is 7.2, hemoglobin is 15. Troponin is negative.   ASSESSMENT AND PLAN: This is a very nice, 81 year old gentleman admitted with chronic obstructive pulmonary disease exacerbation.   1.  Chronic obstructive pulmonary disease exacerbation. The patient having increased wheezing, increased secretions. We are going to treat him with his inhalers Spiriva and Advair nebulizers with albuterol and Atrovent. Pulmonary toilet. Add IV steroids with Solu-Medrol 40 mg IV q. 6, and changed to prednisone taper once the patient is a little bit less congested. Since this seems to be a worsening of his condition, with increased respiratory secretions, some acute bronchitis, we are going to start him on Levaquin IV. Continue to monitor closely. Replace oxygen as needed.   2.  Chronic respiratory failure. At this moment, the patient will continue his regular oxygen of 2 liters, and titrate as needed.   3.  Hyponatremia. Seems to be chronic or secondary to intravascular volume depletion.   4.   Smoking dependency. The patient says that he is not smoking, but still smells heavily of smoke. Could be the clothes, or could be that patient is not wanting to tell me that he is still smoking. Either way, cessation counseling was given to the patient.   5.  Deep vein thrombosis prophylaxis with Lovenox. Gastrointestinal prophylaxis with Pepcid.   He is a FULL CODE.  I spent about 45 minutes with this patient.    ____________________________ Felipa Furnaceoberto Sanchez Gutierrez, MD rsg:mr D: 02/01/2014 19:07:00 ET T: 02/01/2014 19:53:50 ET JOB#: [id number]  cc: Felipa Furnaceoberto Sanchez Gutierrez, MD, <Dictator> Gaia Gullikson Juanda ChanceSANCHEZ GUTIERRE MD ELECTRONICALLY SIGNED 02/18/2014 11:00

## 2014-12-19 NOTE — H&P (Signed)
PATIENT NAME:  Devon Jones, Devon Jones MR#:  [id number] DATE OF BIRTH:  March 27, 1933  DATE OF ADMISSION:  01/04/2014  PRIMARY CARE PHYSICIAN: Neomia Dear. Harrington Challenger, MD   CHIEF COMPLAINT: Shortness of breath and wheezing.  HISTORY OF PRESENT ILLNESS: This is a very pleasant 81 year old male with history of COPD on chronic oxygen with chronic respiratory failure who presents with the above complaint. Over the past week, the patient has had increasing shortness of breath, wheezing, and chest tightness. He presented today for these above symptoms. In the ER, he was noted to be in COPD exacerbation with bilateral wheezing. He was also complaining of some white sputum. The patient says that he was recently switched from his Advair, which he had been taking for a long time, to Spiriva, and he thinks this is the culprit. The patient continues to smoke and is not interested in quitting smoking. He said, "Why should I? I'm 80 years old."   REVIEW OF SYSTEMS:  CONSTITUTIONAL: No fever, fatigue, weakness, weight loss or gain.  EYES: No blurred or double vision.  ENT: No ear pain, hearing loss, seasonal allergies. Positive snoring.  RESPIRATORY: Positive cough, positive wheezing. Positive COPD with chronic respiratory failure. No hemoptysis. Positive dyspnea.  CARDIOVASCULAR: No chest pain, orthopnea, edema, arrhythmia, dyspnea on exertion.  GASTROINTESTINAL: No nausea, vomiting, diarrhea, abdominal pain, melena, or ulcers.  GENITOURINARY: No dysuria or hematuria.  ENDOCRINE: No polyuria or polydipsia.  HEME/LYMPH: No anemia or easy bruising.  SKIN: No rash or lesions.  MUSCULOSKELETAL: No limited activity.  NEUROLOGIC: No history of CVA, TIA, or seizures.  PSYCHIATRIC: No history of anxiety or depression.   PAST MEDICAL HISTORY:  COPD with chronic respiratory failure, on 2 liters of oxygen at home.   MEDICATIONS: He was taking Advair. He is now taking Spiriva. He is also recently started DuoNebs.   SOCIAL HISTORY: The  patient smokes a pack. He says this pack lasted a week; usually a pack a day. No IV drug use or alcohol. He says his wife would not let him drink alcohol.   PAST SURGICAL HISTORY: Rotator cuff surgery.   FAMILY HISTORY: Positive for congestive heart failure.   PHYSICAL EXAMINATION: VITAL SIGNS: Temperature 98.4, pulse 87, respirations 18, blood pressure 114/57, and 87% on 2 liters. He was initially placed on BiPAP when I went to see him. He is off the BiPAP now.  GENERAL: The patient is alert, oriented, speaking full sentences, making jokes, but you could see that he is becoming dyspneic as he is talking.  HEENT: Head is atraumatic. Pupils are round and reactive. Sclerae anicteric. Mucous membranes are moist. Oropharynx is clear.  NECK: Supple. No JVD, carotid bruit, or enlarged thyroid.  CARDIOVASCULAR: Regular rate and rhythm. No murmurs, gallops, or rubs. PMI is not displaced. LUNGS: Poor air movement with wheezing bilaterally, but he has really poor air movement. No rhonchi are heard. No crackles. Normal chest expansion.  ABDOMEN: Bowel sounds are present. Nontender, nondistended. No hepatosplenomegaly.  EXTREMITIES: No clubbing, cyanosis, or edema. NEUROLOGIC: Cranial nerves II through XII are grossly intact. There are no focal deficits.  SKIN: Without rash or lesions.  MUSCULOSKELETAL: The patient is able to move all extremities.   Chest x-ray shows no acute cardiopulmonary disease.   pH 7.44, pCO2 of 39, pO2 of 115. This was on BiPAP 12/6. White blood cells 13, hemoglobin 16.6, hematocrit 48.9, platelets 227,000. Sodium 133, potassium 4.3, chloride 100, bicarbonate 28, BUN 16, creatinine 0.97, glucose 148, calcium 9, bilirubin 0.5, alkaline phosphatase  91, ALT 27, AST 24, total protein 8.3, albumin 3.8. Troponin less than 0.02.   EKG: Accelerated junctional rhythm. No ST elevations or depressions.  ASSESSMENT AND PLAN: This is a 81 year old male with chronic respiratory failure and  chronic obstructive pulmonary disease on 2 liters of oxygen, who presents with chronic obstructive pulmonary disease exacerbation.  1. Acute-on-chronic respiratory failure secondary to acute chronic obstructive pulmonary disease exacerbation secondary to acute bronchitis as outlined below.  2. Acute chronic obstructive pulmonary disease exacerbation. The patient says he was recently switched from Advair to Spiriva. He had been on Advair for many years and he felt that this is working. I suspect that this could be one part of the etiology so will go ahead and place him back on Advair, IV steroids, DuoNebs and oxygen as tolerated. Also, from his acute bronchitis, I will start Levaquin. I also suspect that the patient's ongoing tobacco use is not helping with his chronic obstructive pulmonary disease exacerbation and I did discuss this in a lengthy conversation with him and his family.  3. Acute bronchitis. Will place the patient on Levaquin and follow the patient clinically.  4. Hyponatremia, likely secondary to his underlying pulmonary process. Will follow his sodium level in the morning.  5. Smoking dependence. The patient was counseled for 4 minutes regarding stopping smoking. The patient is not interested in quitting smoking. He says, "I'm 81 years old and I'm not going to quit." I offered a nicotine patch. He will think about this.  6. The patient is a FULL CODE status.    TIME SPENT: Approximately 35 minutes.     ____________________________ Janyth ContesSital P. Juliene PinaMody, MD spm:lm D: 01/04/2014 20:23:50 ET T: 01/04/2014 23:47:38 ET JOB#: [id number]  cc: Kemba Hoppes P. Juliene PinaMody, MD, <Dictator> Neomia Dearavid N. Harrington Challengerhies, MD Dr. Merry LoftyFleming  Anchor Dwan P Tayler Heiden MD ELECTRONICALLY SIGNED 01/09/2014 18:13

## 2014-12-19 NOTE — Discharge Summary (Signed)
PATIENT NAME:  Devon Jones, Devon Jones MR#:  [id number] DATE OF BIRTH:  28-Jun-1933  DATE OF ADMISSION:  02/01/2014  DATE OF DISCHARGE:  02/03/2014  PRIMARY CARE PHYSICIAN: Dr. Harrington Challengerhies  DISCHARGE DIAGNOSES:  1.  Chronic obstructive pulmonary disease exacerbation. 2.  Chronic respiratory failure. 3.  Hyponatremia. 4.  Tobacco abuse.   CODE STATUS:  FULL CODE.   CONDITION: Stable.   HOME MEDICATIONS: Please refer to the medication reconciliation list.  INSTRUCTIONS:  The patient needs home oxygen 2 liters by nasal cannula. Diet: Regular diet.  Activity as tolerated. Followup care:  Follow with PCP within 1 to 2 weeks.   REASON FOR ADMISSION: Shortness of breath.   HOSPITAL COURSE: The patient is a 81 year old gentleman with COPD and chronic respiratory failure, on home oxygen 2 liters, presenting in the ED with shortness of breath for 2 to 3 days. The patient also complains of worsening cough with sputum, and wheezing. In the ED, the patient's O2 saturation was 92% on 2 liters oxygen. For detailed history and physical examination, please refer to the admission note dictated by Dr. Mordecai MaesSanchez. Chest x-ray did not show any acute findings. Sodium 132, WBC 7.2, hemoglobin 15.   1.  COPD exacerbation. After admission, the patient was treated with Solu-Medrol IV with DuoNeb, Levaquin, and oxygen 4 liters by nasal cannula. The patient's oxygen was weaned down to 2 liters today. His symptoms have much improved, but still has a mild cough and shortness of breath. The patient is clinically stable. He will be discharged to home today. I discussed the patient's discharge plan with patient, nurse, case manager.   TIME SPENT:  About 33 minutes.    ____________________________ Devon PollackQing Cecil Bixby, MD qc:mr D: 02/03/2014 16:23:07 ET T: 02/03/2014 20:34:47 ET JOB#: [id number]  cc: Devon PollackQing Linah Klapper, MD, <Dictator> Devon PollackQING Ysenia Filice MD ELECTRONICALLY SIGNED 02/04/2014 15:06

## 2014-12-19 NOTE — Discharge Summary (Signed)
PATIENT NAME:  Devon Jones, Devon Jones MR#:  [id number] DATE OF BIRTH:  08/23/1933  DATE OF ADMISSION:  01/04/2014 DATE OF DISCHARGE:  01/06/2014  For a detailed note, please take a look at the history and physical done on admission by Dr. Juliene PinaMody.  DIAGNOSES AT DISCHARGE:  As follows:  1.  Chronic obstructive pulmonary disease exacerbation.  2.  Shortness of breath due to chronic obstructive pulmonary disease exacerbation.  3.  Ongoing tobacco abuse.   DIET: The patient is being discharged on a regular diet.   ACTIVITY: As tolerated.  Follow up with Dr. Mickey Farberavid Thies in the next 1 to 2 weeks. Also follow up with Dr. Meredeth IdeFleming in 2 weeks.   DISCHARGE MEDICATIONS: Albuterol inhaler as needed, oxygen 2 liters nasal cannula continuous, albuterol nebulizer 4 times daily as needed, Spiriva 1 puff daily, Advair 250/50 one puff b.i.d., prednisone taper starting at 50 mg, down to 10 mg over the next 10 days, Levaquin 750 mg daily x 5 days and Mucinex 600 mg b.i.d. x 7 days.   PERTINENT STUDIES DONE DURING THE HOSPITAL COURSE:  Are as follows: A chest x-ray done on admission showing COPD without acute cardiopulmonary disease.   HOSPITAL COURSE: This is a 81 year old male with medical problems as mentioned above, presented to the hospital with shortness of breath and cough and noted to be in COPD exacerbation.  1.  Chronic obstructive pulmonary disease exacerbation. This was likely the cause of the patient's shortness of breath and cough. This was likely secondary to mild acute bronchitis with ongoing tobacco abuse. The patient was admitted to the hospital, started on aggressive therapy with IV steroids, around-the-clock nebulizer treatments, also maintained on some Advair and empiric Levaquin. The patient has significantly improved since admission, as his bronchospasm and wheezing has resolved. He is already on oxygen at home. At present, I am discharging him on a long prednisone taper along with his maintenance  inhalers include Advair, Spiriva and DuoNeb as needed, along with empiric Levaquin as stated. The patient will follow up Dr. Meredeth IdeFleming in next 2 weeks. 2.  Hyponatremia. 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 He was given some gentle IV fluids. His sodium has now normalized. 3.  Tobacco abuse. The patient was strongly advised to quit smoking. He was not interested in quitting smoking at this time. He also refused a nicotine patch while in the hospital.   The patient is a FULL CODE.   TIME SPENT: 35 minutes.   ____________________________ Rolly PancakeVivek J. Cherlynn KaiserSainani, MD vjs:ce D: 01/06/2014 17:12:43 ET T: 01/06/2014 19:08:59 ET JOB#: [id number]  cc: Rolly PancakeVivek J. Cherlynn KaiserSainani, MD, <Dictator> Neomia Dearavid N. Harrington Challengerhies, MD Dr. Elza RafterFleming  [employer] J Ivaan Liddy MD ELECTRONICALLY SIGNED 01/07/2014 14:08

## 2014-12-19 NOTE — H&P (Signed)
PATIENT NAME:  Devon Jones, Devon Jones MR#:  [id number] DATE OF BIRTH:  04-17-1933  DATE OF ADMISSION:  03/01/2014  PRIMARY CARE PHYSICIAN:  Neomia Dear. Thies, MD  CHIEF COMPLAINT:  Shortness of breath and chest tightness.   HISTORY OF PRESENT ILLNESS:  Mr. Kluth is a 81 year old Caucasian gentleman who was just admitted on June 7th, discharged June 9th  with similar symptoms of chronic obstructive pulmonary disease exacerbation, comes in after he has finished his course of antibiotic with increasing shortness of breath, cough with clear phlegm, occasionally yellow phlegm, no fever to the Emergency Room. He was found to be in significant respiratory failure with chronic obstructive pulmonary disease exacerbation and placed on BiPAP. He is satting 100%. He used BiPAP for about 30 to 45 minutes and was feeling better, wanted to get off of it. He uses normally 2 liters of home oxygen and SVNs, follows up with Dr. Meredeth Ide. The patient is being admitted for further evaluation and management. Chest x-ray shows no pneumonia.   PAST MEDICAL HISTORY:  1.  Chronic obstructive pulmonary disease, chronic with chronic respiratory failure, 2 liters nasal cannula oxygen at home.  2.  Chronic smoker, however, states he quit about a month ago.   ALLERGIES: No known drug allergies.   MEDICATIONS:  1.  Spiriva 18 mcg inhalation daily.  2.  ProAir HFA 2 puffs 4 times a day as needed.  3.  Fluticasone salmeterol.  4.  Advair 250/50 one puff b.i.d.  5.  DuoNebs via nebulizer every 6 hours as needed.   SOCIAL HISTORY: Married, lives at home with wife, ex-smoker. Denies any alcohol use. The patient states he quit, however, he smells heavily of smoke as well. He denies any alcohol or drug use.   PAST SURGICAL HISTORY: Rotator cuff repair. The patient had some polyps in the throat that were removed several years ago.   FAMILY HISTORY: From old records positive for congestive heart failure in both parents.   REVIEW OF SYSTEMS:   CONSTITUTIONAL: No fever. Positive fatigue, weakness.  EYES: No blurred or double vision, glaucoma, cataracts.  ENT: No tinnitus, ear pain, hearing loss or postnasal drip.  RESPIRATORY: Positive for cough, chronic obstructive pulmonary disease, dyspnea. CARDIOVASCULAR: Chest tightness due to shortness of breath upon exertion, positive orthopnea. No hypertension.  GASTROINTESTINAL:  No nausea, vomiting, diarrhea, abdominal pain. No GERD.  GENITOURINARY:  No dysuria, hematuria or frequency.  ENDOCRINE: No polyuria, nocturia or thyroid problems.  HEMATOLOGY: No anemia or easy bruising. No bleeding disorder.  SKIN: No acne, rash or lesion.  MUSCULOSKELETAL: Possible arthritis. No cramps, swelling or gout.  NEUROLOGICAL:  No cerebrovascular accident, transient ischemic attack, dysarthria or tremors.  PSYCHIATRIC:  No anxiety, depression, bipolar disorder.  All other systems reviewed and negative.   PHYSICAL EXAM:  GENERAL:  The patient is awake, alert, oriented x 3, not in acute distress.  VITAL SIGNS:  Afebrile. Pulse is 88, blood pressure 126/54, sats 100% on 2 liters nasal cannula oxygen.  HEENT: Atraumatic, normocephalic. Pupils are equal, round and reactive to light and accommodation. EOMI intact. Oral mucosa is moist.  NECK: Supple. No JVD, no carotid bruit.  RESPIRATORY: There are distant breath sounds, coarse breath sounds bilaterally. No rales or audible wheezing heard at this time. The patient is currently on BiPAP.  He is satting 100% on current settings.   CARDIOVASCULAR: Both heart sounds are normal. Rate rhythm regular. PMI not lateralized. Chest nontender.  ABDOMEN:  Soft, benign, nontender. No organomegaly. Positive bowel sounds.  EXTREMITIES: Good pedal pulses. Good femoral pulses. No lower extremity edema.  NEUROLOGICAL EXAMINATION:  Grossly intact cranial nerves II through XII.  No motor or sensory deficits.  PSYCHIATRIC:  The patient is awake, alert and oriented x 3.  SKIN:  Warm and dry.   LABORATORY, DIAGNOSTIC AND RADIOLOGICAL DATA:  Chest x-ray chronic obstructive pulmonary disease without acute abnormality. CBC within normal limits. Basic metabolic panel within normal limits except sodium of 133, glucose of 138, chloride 97.   ASSESSMENT: A 81 year old, Mr. Montez MoritaCarter, with history of chronic respiratory failure admitted with.  1.  Acute on chronic obstructive pulmonary disease exacerbation. The patient had some increased secretions with wheezing. Will admit him on medical floor. Continue IV steroids and every 6 hour nebulizer treatments, IV Levaquin along with inhalers. Continue to monitor closely and switch him to nasal cannula at this time since he is feeling better with using the BiPAP for about 30 to 45 minutes. 2.  Chronic respiratory failure. The patient will continue his oxygen and titrate as needed.  3.  History of smoking. The patient states he is not smoking but he still smells heavily, however, smoking cessation was done about 4 minutes.  4.  Deep venous thrombosis prophylaxis, subcutaneous heparin.   TIME SPENT: 50 minutes.    ____________________________ Wylie HailSona A. Allena KatzPatel, MD sap:cs D: 03/01/2014 16:58:24 ET T: 03/01/2014 18:18:18 ET JOB#: [id number]  cc: Mel Langan A. Allena KatzPatel, MD, <Dictator> Willow OraSONA A Astin Rape MD ELECTRONICALLY SIGNED 03/02/2014 10:43

## 2014-12-27 NOTE — Consult Note (Signed)
PATIENT NAME:  Devon Jones, Devon Jones MR#:  [id number] DATE OF BIRTH:  1933/08/01  DATE OF CONSULTATION:  09/08/2014  REFERRING PHYSICIAN:  Elby Showersatherine Walsh, MD CONSULTING PHYSICIAN:  Yevonne PaxSaadat A. Mirko Tailor, MD  REASON:  For acute respiratory failure.    HISTORY OF PRESENT ILLNESS:  A 81 year old gentleman who was supposed to have a bronchoscopy today.  On arrival to same-day surgery, he was noted to be visibly in respiratory distress with increased work of breathing, wheezing and cough. The patient had recently completed a round of antibiotics. He actually was supposed to have a bronchoscopy for a cavitary lung lesion which was worrisome for infectious or neoplastic etiology. The patient, because of his condition, it was decided that we would not admit him at this time. He had been having increasing shortness of breath, coughing and wheezing is already noted. He has been having chest pain, which is chronic and over the area of the suspicious lesion in the lung. The patient as mentioned, in December had a chest x-ray, which showed a new left suprahilar density and a CT scan was done which showed a cavitary lesion in the left upper lobe.   PAST MEDICAL HISTORY: Significant for COPD, ongoing tobacco use, cavitary lung lesion on December 31 and chronic O2 use.   SOCIAL HISTORY: Positive for tobacco as already mentioned. No alcohol or drug use.   FAMILY HISTORY: Positive for congestive heart failure.   ALLERGIES: Negative.   MEDICATIONS: Reviewed on the electronic medical record.   REVIEW OF SYSTEMS:  A 12 point review of systems was performed and is basically unremarkable other than what is noted above in the history of present illness.   PHYSICAL EXAMINATION:  GENERAL: At the time he seems more comfortable after receiving a respiratory treatment.  VITAL SIGNS: Temperature was 98, pulse 88, respiratory rate 22, blood pressure 137/76, saturations were 97%. NECK: Supple. No JVD. No adenopathy. No thyromegaly.   CHEST: Showed coarse breath sounds, some rhonchi, no rales. Expansion was equal.  CARDIOVASCULAR: S1, S2 is normal. Regular rhythm. No gallop or rub.  ABDOMEN: Soft, nontender.  NEUROLOGIC: He is awake, alert, moving all 4 extremities.   EXTREMITIES: Without cyanosis or clubbing. Pulses equal.  SKIN: Without any acute rashes.   LABORATORY RESULTS:  The patient's lab work shows white count 6.7, hemoglobin 13.8, hematocrit 41.2. The sodium is 136, potassium 4.1, glucose was 145. Liver enzymes were basically within normal limits. Troponins were negative.   Chest x-ray that was done showed stable left upper lobe lung lesion.   IMPRESSION:  1. Acute on chronic respiratory failure with hypoxia.  2. Pulmonary lesion, lung mass of unknown etiology.   PLAN: We will go ahead and admit right now.  I spoke with the hospitalist service and they will be admitting the patient.  I am going to put off doing his bronchoscopy for now until he is improved. Would start on steroids and aggressive bronchodilator therapy. The patient should get a sputum sent for cultures and while we are doing that, I would also go ahead and get sputum AFBs though I think the likelihood is less. We will continue with other supportive care and monitor. Prognosis is guarded.     ____________________________ Yevonne PaxSaadat A. Wilborn Membreno, MD sak:by D: 09/08/2014 21:02:17 ET T: 09/08/2014 21:29:06 ET JOB#: [id number]  cc: Yevonne PaxSaadat A. Jenisse Vullo, MD, <Dictator> Yevonne PaxSAADAT A Quadarius Henton MD ELECTRONICALLY SIGNED 09/12/2014 10:54

## 2014-12-27 NOTE — Discharge Summary (Signed)
PATIENT NAME:  Devon Jones, Devon Jones MR#:  [id number] DATE OF BIRTH:  04/17/1933  DATE OF ADMISSION:  09/08/2014 DATE OF DISCHARGE:  09/14/2014  ADMITTING PHYSICIAN: Elby Showers, MD  DISCHARGING PHYSICIAN: Enid Baas, MD   PRIMARY CARE PHYSICIAN: Neomia Dear. Harrington Challenger, MD  CONSULTATIONS IN THE HOSPITAL: ID consultation by Dr. Freda Munro. ID consultation by Dr. Sampson Goon.   PRIMARY PULMONOLOGIST: Freda Munro, MD.   DISCHARGE DIAGNOSES:  1. Chronic obstructive pulmonary disease exacerbation with acute bronchitis.  2. Left upper lobe cavitary lesion diagnosed in December of 2015. Could be malignancy, however, bronchoscopy scheduled as an outpatient, acid fast bacilli negative.  3. Tobacco use disorder.  4. Herpes zoster.   DISCHARGE HOME MEDICATIONS:  1. ProAir inhaler 2 puffs 4 times a day as needed for shortness of breath.  2. DuoNebs 3 mL q.6 hours p.r.n. for shortness of breath or wheezing.  3. Spiriva 18 mcg inhalation capsule daily.  4. Azelastine nasal spray, 2 sprays each nostril twice a day.  5. Multivitamin 1 tablet p.o. daily.  6. Advair 250/50, 1 puff b.i.d.  7. Prednisone taper over 6 days.  8. Tramadol 50 mg q.8 hours p.r.n. for pain.  9. Levaquin 750 mg p.o. daily for 3 more days.  10. Valacyclovir 1 gram q.8 hours for 4 more days.   DISCHARGE DIET: Low-sodium diet.   DISCHARGE ACTIVITY: As tolerated.    FOLLOWUP INSTRUCTIONS:  1. Follow up with Dr. Freda Munro for bronchoscopy in 1 to 2 days.  2. PCP followup in 2 weeks.  3. Smoking cessation.  4. Home oxygen 3 liters.   LABORATORY DATA AND IMAGING STUDIES PRIOR TO DISCHARGE: WBC 7.3, hemoglobin 12.8, hematocrit 36.5, platelet count 237,000.   Sodium 133, potassium 4.4, chloride 97, bicarbonate 29, BUN 22, creatinine 1.0 and glucose 163, and calcium of 8.3. Sputum cultures negative. AFB smears: The stains are negative x3. Cultures are pending at this time. Chest x-ray on admission showing stable left upper lobe  lung lesion, changes of emphysema, chronic pulmonary scarring, and bibasilar atelectasis noted. Blood cultures are negative.   BRIEF HOSPITAL COURSE: Mr. Devon Jones is a 81 year old Caucasian male with past medical history of chronic respiratory failure secondary to chronic obstructive pulmonary disease, on 2 to 3 liters home oxygen, who was following with Dr. Freda Munro. He had an outpatient CT done at the end of December, which showed left upper lobe cavitary lesion. The patient was scheduled for bronchoscopy procedure; however, went into respiratory distress with chronic obstructive pulmonary disease exacerbation. He was admitted to the hospital.   1. Acute on chronic chronic obstructive pulmonary disease exacerbation with acute bronchitis. The patient was admitted.  IV steroids were given.  He was on nebulizers, inhalers, and since seen also by Dr. Welton Flakes for bronchitis symptoms. He was started on antibiotics with Levaquin. Symptoms have resolved. H is being discharged on oral prednisone and Levaquin to finish up the course. All of his inhalers are being continued.  2. Left upper lobe cavitary lesion, could be malignancy. The patient is a chronic smoker. A scan was performed on 08/27/2014. Bronchoscopy was supposed to be done, but because of his respiratory status, it has been moved over.  Now that he is improved, he will have a bronchoscopy done this week, according to Dr. Welton Flakes. Because it is a cavitary lesion, though less likely to be tuberculosis, he was kept on airborne isolation and had three AFB smears, which came back negative. Cultures are pending. So no further treatment recommended. He  was also seen by an infectious disease physician in the hospital.  3. His course has been otherwise uneventful in the hospital. He did have herpes zoster lesions on his back on his left side dermatomal distribution. They are already scabbed.  Started by Valtrex by Dr. Sampson GoonFitzgerald. He will finish up the course for 10  days. Lesions are scabbed and patient is asymptomatic.   DISCHARGE CONDITION: Stable.   DISCHARGE DISPOSITION:  Home.  TIME SPENT ON DISCHARGE: 40 minutes.   ____________________________ Enid Baasadhika Jomes Giraldo, MD rk:ap D: 09/14/2014 14:58:01 ET T: 09/14/2014 15:41:14 ET JOB#: [id number]  cc: Enid Baasadhika Nisreen Guise, MD, <Dictator> Yevonne PaxSaadat A. Khan, MD Neomia Dearavid N. Harrington Challengerhies, MD  Enid BaasADHIKA Virga Haltiwanger MD ELECTRONICALLY SIGNED 09/17/2014 11:11

## 2014-12-27 NOTE — Consult Note (Signed)
PATIENT NAME:  Devon Jones, Devon Jones MR#:  [id number] DATE OF BIRTH:  1933-07-21  DATE OF CONSULTATION:  09/09/2014  REFERRING PHYSICIAN:    CONSULTING PHYSICIAN:  Stann Mainland. Sampson Goon, MD  REQUESTING PHYSICIAN: Dr. Clent Ridges.  REASON FOR CONSULTATION: Cavitary lesion on chest CT as well as shingles.   HISTORY OF PRESENT ILLNESS: This is a 81 year old gentleman with history of COPD on home O2 at 2 liters, long history of tobacco abuse, who was admitted January 12th after he had  relatively acute worsening of his chronic respiratory failure while he was being prepped for bronchoscopy. He had a cavitary lesion noted on a CT done in December. This was due to be further evaluated. Since admission, the patient was also noted to have a shingles eruption on his back. He has been treated for a COPD exacerbation and currently reports feeling slightly better.   The patient denies having fevers or night sweats over the last several months. He denies losing weight. He denies any known TB contacts but cannot recall ever having a PPD placed. He was in the [employer] and did serve [place], but has limited contacts except on ships, he reports. He is a retired Hydrologist.   PAST MEDICAL HISTORY: 1. COPD on home O2, due to chronic respiratory failure.  2. Tobacco abuse.  3. Cavitary lesion on CT, as above.   PAST SURGICAL HISTORY: None.   SOCIAL HISTORY: Lives with his wife. He continues to smoke occasionally. He does not drink alcohol. Retired from [employer] in [employer] as above. No prior TB exposures.   FAMILY HISTORY: Noncontributory.   ALLERGIES: No known drug allergies.   ANTIBIOTICS SINCE ADMISSION: Include levofloxacin. He is also on nebulizers. Advair and methylprednisolone. He was started on Valtrex 1000 mg q. 8 hours.   REVIEW OF SYSTEMS: 11 systems reviewed and negative except as per HPI.   PHYSICAL EXAMINATION: VITAL SIGNS: Temperature 96.6, pulse 80, blood pressure 128/77,  respirations 20, saturation 95% on 3 liters.  GENERAL: He is awake alert, pleasant, interactive.  HEENT: Pupils reactive. Oropharynx clear.  NECK: Supple.  HEART: Regular.  LUNGS: Very poor air movement and mild expiratory wheeze.  ABDOMEN: Soft, nontender.  EXTREMITIES: No clubbing, cyanosis, or edema system.  LYMPHATICS: No anterior cervical, posterior cervical or supraclavicular lymphadenopathy.  EXTREMITIES: No clubbing, cyanosis or edema.  NEUROLOGIC: He is alert and oriented x3. Grossly nonfocal neurological exam.   DATA: CT of the chest done December 31 shows cavitary lesion in the left upper lobe worrisome for lung malignancy, lung abscess or bacterial versus fungal infection. No lymphadenopathy is noted.   White blood count on admission was 6.7, currently it is 8.2, hemoglobin 13.1, platelets 198,000. Blood cultures x2 are negative. Renal function is normal. LFTs normal except albumin slightly low at 3.2.   IMPRESSION: A 81 year old prolonged history of heavy smoking as well as chronic obstructive pulmonary disease on home 2 liters for chronic respiratory failure. He has had several recent pneumonia type symptoms.  Multiple chest x-rays have been done and he was found to have a cavitary lesion in the left upper lobe on CT.   RECOMMENDATIONS: 1. Check sputum x3.  2. Continue isolation.  3. He will likely need bronchoscopy for further evaluation as he is not producing much sputum.  4. For the shingles, this is almost scabbed over. He will finish a course of Valtrex for this.  5. Continue levofloxacin for a 7 day course at this point, but further recommendations will be  based on bronchoscopy results.   Thank you for the consult. I will be glad to follow with you.    ____________________________ Stann Mainlandavid P. Sampson GoonFitzgerald, MD dpf:ST D: 09/09/2014 16:04:19 ET T: 09/09/2014 16:36:46 ET JOB#: [id number]  cc: Stann Mainlandavid P. Sampson GoonFitzgerald, MD, <Dictator> Jashira Cotugno Sampson GoonFITZGERALD MD ELECTRONICALLY SIGNED  09/23/2014 20:58

## 2014-12-27 NOTE — Discharge Summary (Signed)
PATIENT NAME:  Devon ShoemakerCARTER, Amrit MR#:  [id number] DATE OF BIRTH:  Jan 03, 1933  DATE OF ADMISSION:  11/25/2014 DATE OF DISCHARGE:  12/01/2014  DISPOSITION: The patient was discharged to hospice home.   HOSPICE HOME ADMISSION DIAGNOSES:  1.  End-stage chronic obstructive pulmonary disease.  2.  Pulmonary aspergillosis.  3.  Pseudomonal pneumonia.   MEDICATIONS ON DISCHARGE: 1.  Furosemide 20 mg once a day.  2.  Lorazepam 0.5 mg oral sublingually every 2 to 4 hours as needed for anxiety.  3.  Morphine 20 mg per mL concentrated, give 5 mL every 1 to 2 hours as needed for anxiety.  4.  Zofran 4 mg oral tablet disintegrating every 6 hours as needed for nausea.   HISTORY OF PRESENTING ILLNESS AND HOSPITAL COURSE: A 81 year old male with known history of COPD and 2 liters oxygen use at home was recently admitted and diagnosed with aspergillosis in February. He started on voriconazole oral on the 10th of February by Dr. Sampson GoonFitzgerald. Also was given prednisone and was doing okay, but lately for the last few days he started getting worse and worse. He went to see Dr. Freda MunroSaadat Khan and his PA gave him some fluid pills and Solu-Medrol injection and asked him to come to the Emergency Room. Chest x-ray showed worsening bilateral airspace opacity and so started on BiPAP and admitted to the hospital for further assessment.   HOSPITAL COURSE AND STAY:  1.  Bilateral pneumonia with severe sepsis, acute on chronic respiratory failure on admission. The patient also has some component of CHF. Likely it was bacterial infection or aspergillosis. He was on vancomycin and Zosyn and azithromycin. Continued on IV Solu-Medrol and nebulizer treatment, initially on BiPAP and then high-flow nasal cannula oxygen. Pulmonary consult was called in and Dr. Belia HemanKasa was on the case. He continued to follow with the patient. The patient remained on voriconazole formula for 5 to 6 days without significant improvement. Was also followed by palliative  care. Finally, he and his family decided to go for hospice home as there was no significant improvement and this was a recurrent problem, so he was discharged to hospice.   2.  Acute CHF, systolic. Ejection fraction was low and was started on IV Lasix.  3.  Hypertension. Blood pressure was stable in the hospital.   CONSULTING HOSPITAL: Dr. Belia HemanKasa for pulmonary and Dr. Harvie JuniorPhifer for palliative care.   IMPORTANT LABORATORY RESULTS ON PRESENTATION: WBC on admission 33.1, hemoglobin 12.3, platelet count 223,000, MCV is 98. Glucose level 172, BUN 53, creatinine 1.26, sodium 136, potassium 4.9. HIV were negative. Blood culture showed no growth. Lactic acid was 2.3. Urinalysis was grossly negative. Echocardiogram was done, which showed ejection fraction 30% to 35%, impaired relaxation of left ventricle and diastolic filling, normal right ventricular size and systolic function.   TOTAL TIME SPENT ON THIS DISCHARGE: 25 minutes.    ____________________________ Hope PigeonVaibhavkumar G. Elisabeth PigeonVachhani, MD vgv:at D: 12/02/2014 23:14:21 ET T:  09:10:17 ET JOB#: [id number]  cc: Hope PigeonVaibhavkumar G. Elisabeth PigeonVachhani, MD, <Dictator> Altamese DillingVAIBHAVKUMAR Suman Trivedi MD ELECTRONICALLY SIGNED 12/21/2014 23:34

## 2014-12-27 NOTE — Consult Note (Signed)
Chief Complaint:  Subjective/Chief Complaint doing better breathing is less labored and no wheeze noted   VITAL SIGNS/ANCILLARY NOTES: **Vital Signs.:   16-Jan-16 08:03  Vital Signs Type Routine  Temperature Temperature (F) 97.9  Celsius 36.6  Temperature Source oral  Pulse Pulse 75  Respirations Respirations 16  Systolic BP Systolic BP 093  Diastolic BP (mmHg) Diastolic BP (mmHg) 70  Mean BP 84  Pulse Ox % Pulse Ox % 90  Pulse Ox Activity Level  At rest  Oxygen Delivery Room Air/ 21 %  *Intake and Output.:   Daily 16-Jan-16 07:00  Grand Totals Intake:  300 Output:  1300    Net:  -1000 34 Hr.:  -1000  IV (Primary)      In:  0  IV (Secondary)      In:  300  Urine ml     Out:  1300  Length of Stay Totals Intake:  1210 Output:  4753    Net:  -3543   Brief Assessment:  GEN no acute distress   Cardiac Regular  --Rub  --Gallop   Respiratory normal resp effort  clear BS  no use of accessory muscles   Gastrointestinal Normal   Gastrointestinal details normal Soft  Nontender  Nondistended   EXTR negative cyanosis/clubbing   Lab Results: Routine Chem:  16-Jan-16 04:26   Creatinine (comp) 1.00  eGFR (African American) >60  eGFR (Non-African American) >60 (eGFR values <56m/min/1.73 m2 may be an indication of chronic kidney disease (CKD). Calculated eGFR, using the MRDR Study equation, is useful in  patients with stable renal function. The eGFR calculation will not be reliable in acutely ill patients when serum creatinine is changing rapidly. It is not useful in patients on dialysis. The eGFR calculation may not be applicable to patients at the low and high extremes of body sizes, pregnant women, and vegetarians.)   Assessment/Plan:  Assessment/Plan:  Assessment 1. Acute respiratory failure with hypoxia -will continue with oxygen therapy -will continue with steroids -awaiting final on AFB smears -possible bronch next week   Electronic Signatures: KAllyne Gee(MD)  (Signed 16-Jan-16 10:16)  Authored: Chief Complaint, VITAL SIGNS/ANCILLARY NOTES, Brief Assessment, Lab Results, Assessment/Plan   Last Updated: 16-Jan-16 10:16 by KAllyne Gee(MD)

## 2014-12-27 NOTE — Consult Note (Signed)
PATIENT NAME:  Devon Jones, Devon Jones MR#:  [id number] DATE OF BIRTH:  [birth year]  DATE OF CONSULTATION:  11/27/2014  REFERRING PHYSICIAN:  Hope Pigeon. Elisabeth Pigeon, MD  CONSULTING PHYSICIAN:  Marcina Millard, MD  PRIMARY CARE PHYSICIAN: Neomia Dear. Harrington Challenger, MD.   PULMONOLOGIST: Dewayne Hatch, MD   INFECTIOUS DISEASE: Stann Mainland. Sampson Goon, MD   CHIEF COMPLAINT: Shortness of breath.   REASON FOR CONSULTATION: Consultation requested for evaluation and management of congestive heart failure.   HISTORY OF PRESENT ILLNESS: The patient is a 81 year old gentleman with known COPD on chronic oxygen therapy, who is admitted at this time with shortness of breath, hypoxia, and pneumonia. The patient has had a recent diagnosis of aspergillosis, followed by Dr. Sampson Goon. The patient was experiencing increasing shortness of breath, presented to [facility] Emergency Room, where he was noted to be hypoxic. Chest x-ray revealed worsening bilateral airspace opacities consistent with worsening pneumonia, and the patient was admitted on BiPAP. The patient currently is on wide spectrum antibiotic coverage. Admission labs were notable for elevated lactic acid, borderline elevated troponin, and markedly elevated white count of 33,000 consistent with pneumonia, and sepsis.   The patient denies prior cardiac history. Echocardiogram was performed on 11/26/2014, which revealed moderately reduced left ventricular function with LVEF of 30% to 35%, without prior echocardiogram to compare. Mild valvular abnormalities were present. The patient denies pedal edema and does not appear to be fluid overloaded.   PAST MEDICAL HISTORY: 1. COPD on chronic oxygen therapy.  2. Aspergillosis.  3.  Continued tobacco abuse.  4. History of herpes zoster.   MEDICATIONS: Voriconazole 200 mg daily, albuterol nebulizer q. 6 p.r.n., azelastine 2 sprays intranasal b.i.d., Flonase 1 puff b.i.d., Lasix 20 mg daily, multivitamin 1 daily, prednisone 60 mg with  10 mg taper daily, ProAir 2 puffs q.i.d. p.r.n., Spiriva once daily, tramadol 50 mg q. 8 hours.   SOCIAL HISTORY: The patient is married, resides with his wife. Continues to smoke.   FAMILY HISTORY: No immediate family history of myocardial infarction or coronary artery disease.   REVIEW OF SYSTEMS:  CONSTITUTIONAL: The patient has had fever and chills.  EYES: No blurry vision.  EARS: No hearing loss.  RESPIRATORY: Shortness of breath, as described above.  CARDIOVASCULAR: The patient has mild intermittent nonexertional chest pain.  GASTROINTESTINAL: No nausea, vomiting, or diarrhea.  GENITOURINARY: No dysuria or hematuria.  ENDOCRINE: No polyuria or polydipsia.  MUSCULOSKELETAL: No arthralgias or myalgias.  NEUROLOGICAL: No focal muscle weakness or numbness.  PSYCHOLOGICAL: No depression or anxiety.   PHYSICAL EXAMINATION: VITAL SIGNS: Blood pressure 135/75, pulse 86, respirations 24, temperature 37, pulse oximetry 97%.  GENERAL: The patient is a critically ill-appearing gentleman in mild to moderate respiratory distress.  HEENT: Pupils equal and reactive to light and accommodation.  NECK: Supple without thyromegaly.  LUNGS: Decreased breath sounds with bilateral expiratory wheezes.  CARDIOVASCULAR: Normal JVP. Normal PMI. Regular rate and rhythm. Normal S1, S2. No appreciable gallop, murmur, or rub.  ABDOMEN: Soft and nontender. Pulses were intact bilaterally.  MUSCULOSKELETAL: Normal muscle tone.  NEUROLOGIC: The patient is alert and oriented x 3. Motor and sensory are both grossly intact.   IMPRESSION: A 81 year old gentleman with recent history of aspergillosis, presents with bilateral pneumonia, chronic obstructive pulmonary disease exacerbation, initial lab work consistent with sepsis. Respiratory failure very likely multifactorial, with a component of congestive heart failure with a moderate cardiomyopathy with LV ejection fraction of 30% to 35%. The patient currently does not  appear to be fluid overloaded.  RECOMMENDATIONS: 1. Diurese as needed. We will initially give the patient Lasix 40 mg IV push.  2. Once the patient's pulmonary status becomes a little more stabilized, we will initiate therapy with carvedilol 3.25 mg b.i.d.  3. Would defer further cardiac diagnostics at this time.  4. Defer full-dose anticoagulation for borderline elevated troponin, which is likely due to demand supply ischemia.  5. At some point, we will initiate therapy with ACE inhibitor for cardiomyopathy.     ____________________________ Marcina MillardAlexander Ines Rebel, MD ap:mw D: 11/27/2014 15:59:54 ET T: 11/27/2014 16:41:37 ET JOB#: [id number]  cc: Marcina MillardAlexander Basheer Molchan, MD, <Dictator> Marcina MillardALEXANDER Paiden Cavell MD ELECTRONICALLY SIGNED 12/22/2014 17:15

## 2014-12-27 NOTE — H&P (Signed)
PATIENT NAME:  Devon Jones, Devon Jones MR#:  [id number] DATE OF BIRTH:  [birth year]  DATE OF ADMISSION:  09/08/2014  PRIMARY CARE PHYSICIAN: Neomia Dearavid N. Harrington Challengerhies, MD  REFERRING PHYSICIAN: Yevonne PaxSaadat A. Khan, MD, pulmonology  REASON FOR ADMISSION:   Shortness of breath.  HISTORY OF PRESENT ILLNESS: This 81 year old man with a past medical history of COPD with chronic respiratory failure on 2 L of oxygen at home, presented today for a bronchoscopy, for further evaluation of the cavitary lung lesion seen on CT scan in December 2015. When he came for his procedure, it was noted that he was wheezing and very short of breath. The procedure was canceled, and he has been admitted to the hospital for treatment of COPD exacerbation before bronchoscopy can be performed. Hospitalist services have been asked to admit for further evaluation and treatment.   The history obtained is obtained from the patient and his wife. They report that he has been doing poorly for greater than 1 month, with worsening shortness of breath and wheezing. Over the past few days, he has had a pronounced cough with sputum production. No hemoptysis. He has had a decreased appetite and decreased exercise tolerance. He has had some fevers and sweats. He has had a red rash over his forehead and face, which his family has seen in the past. They report that, after the rash occurs, his skin peels, as if sunburnt. He also complains of pain in the back. His wife reports seeing some blister-type lesions over his back recently. His wife reports that his mood has been poor, he has been very anxious and unhappy, and also has had some occasional confusion. She reports that he is not moving at home, sits in one place, sleeps in a chair.   Reviewing his most recent chest x-rays, on 08/10/2014, x-ray showed emphysema with right base atelectasis or pneumonia. The patient states that, at that time, he was diagnosed with pneumonia and was given a course of Levaquin and  prednisone, which he has now completed. He had a repeat chest x-ray performed on 08/26/2015, which showed increased prominence of left suprahilar density, and CT scanning was recommended. CT scan performed 08/27/2014 showed a cavitary lesion of the left upper lobe worrisome for lung malignancy. Other considerations include lung abscess, bacterial versus fungal infection.   PAST MEDICAL HISTORY:  1.  Chronic obstructive pulmonary disease.  2.  Chronic respiratory failure on 2 L of nasal cannula at home.  3.  Ongoing tobacco abuse.  4.  Cavitary lung lesion visualized on CT scan 08/27/2014.   SOCIAL HISTORY: The patient lives with his wife. He is on 2 L of oxygen via nasal cannula. He does not use a cane or a walker. He currently smokes only occasionally, but has a long history of smoking cigarettes. He does not drink alcohol or use any illicit substances.   FAMILY MEDICAL HISTORY: Positive for congestive heart failure in both parents.   ALLERGIES: No known allergies.   HOME MEDICATIONS:  1.  Spiriva 18 mcg inhalation 1 capsule inhaled via HandiHaler once a day.  2.  ProAir HFA CFC free 90 mcg/inhalations 2 puffs inhaled 4 times a day as needed for shortness of breath.  3.  Multivitamin 1 tablet daily.  4.  Fluticasone salmeterol 250 mcg/50 mcg inhalation powder, 1 puff inhaled twice a day.  5.  Dulera 5 mcg/100 mcg inhalation, 2 puffs inhaled twice a day.  6.  Azelastine nasal spray 137 mcg/inhalations 2 sprays to both nostrils twice a  day.  7.  Albuterol 2.5 mg/3 mL, 0.83% inhalation solution 1 vial inhaled every 6 hours as needed for shortness of breath or wheezing.   REVIEW OF SYSTEMS:  CONSTITUTIONAL: Positive for fevers, sweats, weakness, decreased exercise tolerance, back and chest pain. No change in weight.  HEENT: No pain in eyes or ears. No change in vision or hearing. No sore throat or difficulty swallowing.  RESPIRATORY: Positive for shortness of breath, wheezing, cough with  sputum production. No hemoptysis.  CARDIOVASCULAR: Positive for chest pain, which is over all of the ribs, front and back, and worse with coughing or deep inspiration. No edema, syncope, palpitations.  GASTROINTESTINAL: No diarrhea, constipation, melena, hematochezia, or abdominal pain.  GENITOURINARY: No frequency or dysuria.  MUSCULOSKELETAL: No joint effusions. Positive for myalgias and chronic worsening back pain.  SKIN: Positive for a rash with blisters over the back.  NEUROLOGIC: No focal numbness or weakness. No confusion noted by the patient, though his wife does report some confusion mainly in the evenings of short-term memory. No headaches, seizure, history of stroke.  PSYCHIATRIC: No history of bipolar disorder or schizophrenia.   PHYSICAL EXAMINATION:  VITAL SIGNS: Temperature 98.1, pulse 82, respirations 28, blood pressure is 123/79, oxygenation 98% on 2 L nasal cannula.  GENERAL: The patient is in mild respiratory distress with rapid shallow respirations; otherwise, resting comfortably in the exam bed.  HEENT: Pupils equal, round, and reactive to light. Conjunctivae clear. Extraocular motion intact. Oral mucous membranes are pink and moist. Good dentition. Posterior oropharynx is clear with no exudate, erythema, or edema.  NECK: Supple. Trachea is midline. No cervical lymphadenopathy.  RESPIRATORY: He has mild diffuse expiratory wheezing in all lung fields. No rhonchi. He is breathing fairly rapidly at 28 respirations minute. No crackles.  CARDIOVASCULAR: Distant heart sounds, regular. No murmurs, rubs, or gallops. No peripheral edema. Peripheral pulses are 1+.  ABDOMEN: It seems that his abdominal muscles are sore. He is diffusely tender, but this is only superficial. No guarding, no rebound, no hepatosplenomegaly. Bowel sounds are normal.  MUSCULOSKELETAL: No joint effusions. Range of motion is normal. Strength is 5/5 throughout.  NEUROLOGIC: Cranial nerves II through XII grossly  intact. Strength and sensation are intact.  PSYCHIATRIC: He is anxious, fairly agitated at being admitted. He is alert and oriented x 4.  SKIN: horizontal linear distribution of clustered scabs over the left midback, very tender to palpation, no blisters, open lesions or exudate present. No other dermatome involved.  LABORATORY DATA: Sodium 136, potassium 4.1, chloride 101, bicarbonate 31, BUN 11, creatinine 0.89, glucose 145, calcium 9.0, protein 6.8, albumin 3.2, bilirubin 0.6, alkaline phosphatase 86, AST 17, ALT 26. White blood cells 6.7, hemoglobin 13.8, hematocrit 41.2, platelets 199,000, MCV 97.   IMAGING: No imaging at this time.   ASSESSMENT AND PLAN:  1.  Chronic obstructive pulmonary disease exacerbation: We will start q. 4 hour nebulizers, IV steroids, and IV Levaquin. We will get a chest x-ray STAT. We will also get an EKG. Hopefully, we can control his COPD by the end of the week so he can have bronchoscopy.  2.  Cavitary lung lesion. We will rule out tuberculosis with 3 sputum for acid fast bacteria. He is in a negative pressure room on airborn precautions. We will also get routine sputum cultures. Blood cultures x 2. Dr. Welton Flakes will continue to follow the patient during his hospitalization in preparation for bronchoscopy later this week. He also has a PET scan ordered, I will defer to Dr. Welton Flakes  on the timing of his PET scan.  3.  Ongoing tobacco abuse. Smoking cessation counseling provided today by me for 3-5 minutes. The patient has decreased considerably in smoking, but reports that stress causes him to continue smoking.  4.  Varicella zoster infection. We will start Valtrex, though the lesions are mostly scabbed and this may not be of much benifit. We will provide pain relief with Vicodin.   5.  Prophylaxis: Heparin for deep vein thrombosis prophylaxis. No gastrointestinal prophylaxis at this time.   TIME SPENT ON ADMISSION: 55 minutes.    ____________________________ Ena Dawley.  Clent Ridges, MD cpw:MT D: 09/08/2014 14:19:03 ET T: 09/08/2014 14:34:44 ET JOB#: [id number]  cc: Santina Evans P. Clent Ridges, MD, <Dictator> Gale Journey MD ELECTRONICALLY SIGNED 09/08/2014 22:04

## 2014-12-27 NOTE — H&P (Signed)
PATIENT NAME:  Devon ShoemakerCARTER, Devon MR#:  [id number] DATE OF BIRTH:  June 12, 1933  DATE OF ADMISSION:  11/25/2014  PRIMARY CARE PHYSICIAN: Neomia Dearavid N. Harrington Challengerhies, MD  PULMONARY: Yevonne PaxSaadat A. Khan, MD  INFECTIOUS DISEASE: Stann Mainlandavid P. Sampson GoonFitzgerald, MD    EMERGENCY DEPARTMENT PHYSICIAN: Eartha InchCory R. York CeriseForbach, MD  CHIEF COMPLAINT: Shortness of breath and hypoxia.   HISTORY OF PRESENT ILLNESS: The patient is a 81 year old male with a known history of COPD requiring 2 liters oxygen via nasal cannula at home all the time, recently diagnosed for aspergillosis in February and was started on voriconazole orally on February 10 by Dr. Sampson GoonFitzgerald. He has also been on and off on prednisone and was doing okay, but lately he has been getting more and more short of breath, was seen by Dr. Sampson GoonSaadat Khan's PA on Monday, Dr. Esperanza SheetsEric Turner, who gave him some fluid pill as he was having significant lower extremity edema and gave him Solu-Medrol injection, as per family. He was asked to come to the Emergency Department if he continues to get worse, which he did, as he was having difficulty breathing and was having his oxygen saturations in low 80s for the last 3 days in spite of being on 2 liters oxygen. EMS was called who placed him on nonrebreather mask and oxygen saturations came up to 92%, was brought down to the Emergency Department. While in the ED, his chest x-ray shows worsening bilateral airspace opacities, greater on the right, findings consistent with worsening pneumonia and/or associated hemorrhage. The patient was quite hypoxic in the Emergency Department and had been placed on BiPAP and is being admitted for further evaluation and management.   PAST MEDICAL HISTORY:  1.  COPD requiring 2 liters oxygen via nasal cannula.  2.  Recent diagnosis of aspergillosis, likely in February.  3.  Herpes zoster.  4.  Tobacco use disorder.   SOCIAL HISTORY: Occasional smoking. No alcohol. No illicit drug use.   FAMILY HISTORY: Positive for history of  heart failure in both parents.   ALLERGIES: No known drug allergies.   MEDICATIONS AT HOME:  1.  Voriconazole 200 mg p.o. daily, which was started on February 10.  2.  Albuterol nebulizer every 6 hours as needed.  3.  Azelastine 2 sprays intranasally twice a day.  4.  Flonase 1 puff inhaled twice a day.  5.  Lasix 20 mg p.o. daily, which was started on Monday by Dr. Esperanza SheetsEric Turner.  6.  Multivitamin once daily.  7.  Prednisone 60 mg with 10 mg taper every other day. Again was started, likely last Monday.  8.  ProAir HFA 2 puffs inhaled 4 times a day as needed.  9.  Spiriva once daily.  10.  Tramadol 50 mg p.o. every 8 hours.   REVIEW OF SYSTEMS: CONSTITUTIONAL: No fever. Positive fatigue and weakness.  EYES: No blurred or double vision.  ENT: No tinnitus or ear pain.  RESPIRATORY: No cough. Positive for wheezing. No hemoptysis. CARDIOVASCULAR: No chest pain, orthopnea, edema.  GASTROINTESTINAL: No nausea, vomiting, diarrhea.  GENITOURINARY: No dysuria or hematuria.  ENDOCRINE: No polyuria or nocturia.  HEMATOLOGY: No anemia, easy bruising.  SKIN: No rash or lesion.  MUSCULOSKELETAL: No arthritis. He does have some muscle aches and chronic back pain.  NEUROLOGIC: No tingling or numbness. He does have generalized weakness. PSYCHIATRIC: No history of anxiety or depression.   PHYSICAL EXAMINATION:  VITAL SIGNS: Temperature 98.1, heart rate 101 per minute, respirations 26 per minute, blood pressure 111/64. He is saturating  90% on nonrebreather mask, but has been saturating 95% on BiPAP.  GENERAL: The patient is a 81 year old male lying in the bed in acute respiratory distress.  EYES: Pupils equal, round, reactive to light and accommodation. No scleral icterus. Extraocular muscles intact.  HENT: Head atraumatic, normocephalic. Oropharynx and nasopharynx dry and clear. He does have BiPAP applied.  NECK: Supple. No jugular venous distention. No thyroid enlargement or tenderness.  LUNGS:  Decreased breath sounds on the bases. Expiratory wheezing throughout both lungs. He does have rhonchi and rales at the right lung base, somewhat tachypneic.  CARDIOVASCULAR: S1, S2 normal. No murmurs, rubs, or gallop.  ABDOMEN: Soft, nontender, nondistended. Bowel sounds present. No organomegaly or mass.  EXTREMITIES: He has 2 to 3+ pedal edema. No cyanosis or clubbing.  NEUROLOGIC: Cranial nerves II-XII intact. Muscle strength 4/5 in all extremities. Sensation intact.  PSYCHIATRIC: The patient is alert and oriented x3.  SKIN: No obvious rash, lesion, ulcer.  MUSCULOSKELETAL: No joint effusion or tenderness.   LABORATORY PANEL: Normal BMP, except BUN of 53, creatinine 1.26. Normal liver function tests, except alkaline phosphatase of 146. Troponin of 0.60. CBC showed white count of 33.1, hemoglobin 12.3, hematocrit 38.2, platelet 223, 000. PT of 15.2, INR 1.2.   Chest x-ray in the Emergency Department showed worsening bilateral airspace opacities, greater on the right.   EKG shows sinus tachycardia, no major ST-T changes.   IMPRESSION AND PLAN:  1.  Sepsis with tachycardia, leukocytosis, pneumonia would be the source. We will start him on vancomycin, Zosyn and Zithromax. Obtain 2 sets of blood cultures and sputum culture if able.  2.  Acute on chronic respiratory failure, likely due to underlying pneumonia with invasive aspergillosis. We will add amphotericin B and continue oral voriconazole. Consult pulmonary. The case was discussed with Dr. Belia Heman. Renal function will be closely monitored as this medication can be nephrotoxic.  3.  Chronic obstructive pulmonary disease. Continue nebulizers. Hold off steroids due to their immunosuppressive effect on a patient with invasive aspergillosis. Continue Advair and Spiriva.  4.  Acute renal failure. We will avoid nephrotoxins, We will monitor renal function closely, not able to give much IV fluid considering significant lower extremity edema.  5.  Lower  extremity edema. We will get 2-D echocardiogram. Consider cardiology consult if needed.   CODE STATUS: Full code.   TOTAL TIME TAKING CARE OF THIS PATIENT: Fifty-five minutes (critical care).   He remains at very high risk for acute cardiorespiratory failure. His prognosis is very poor. He may have multi-organ failure and certainly may not respond to the treatment as aspergillosis seems to be disseminated. Looking back at his culture from his bronchoscopy, it looks like his CMV is also positive. We will try to start him on some CMV treatment. also We will admit him to CCU at least at this time.   ____________________________ Ellamae Sia. Sherryll Burger, MD vss:TM D: 11/25/2014 18:33:37 ET T: 11/25/2014 19:31:20 ET JOB#: [id number]  cc: Yanelle Sousa S. Sherryll Burger, MD, <Dictator> Neomia Dear. Harrington Challenger, MD Yevonne Pax, MD Stann Mainland. Sampson Goon, MD Ellamae Sia [facility] MD ELECTRONICALLY SIGNED 11/27/2014 21:12

## 2014-12-27 DEATH — deceased

## 2015-06-04 IMAGING — CR DG CHEST 2V
1 series · 2 of 2 positions shown · non-contrast
Comparison: 09/21/2014

CLINICAL DATA: Cough

EXAM:
CHEST  2 VIEW

[Series 1: kdxr chest pa (or ap) and lat · 0.14mm/px · 2 of 2 slices shown]
[im 1/2]
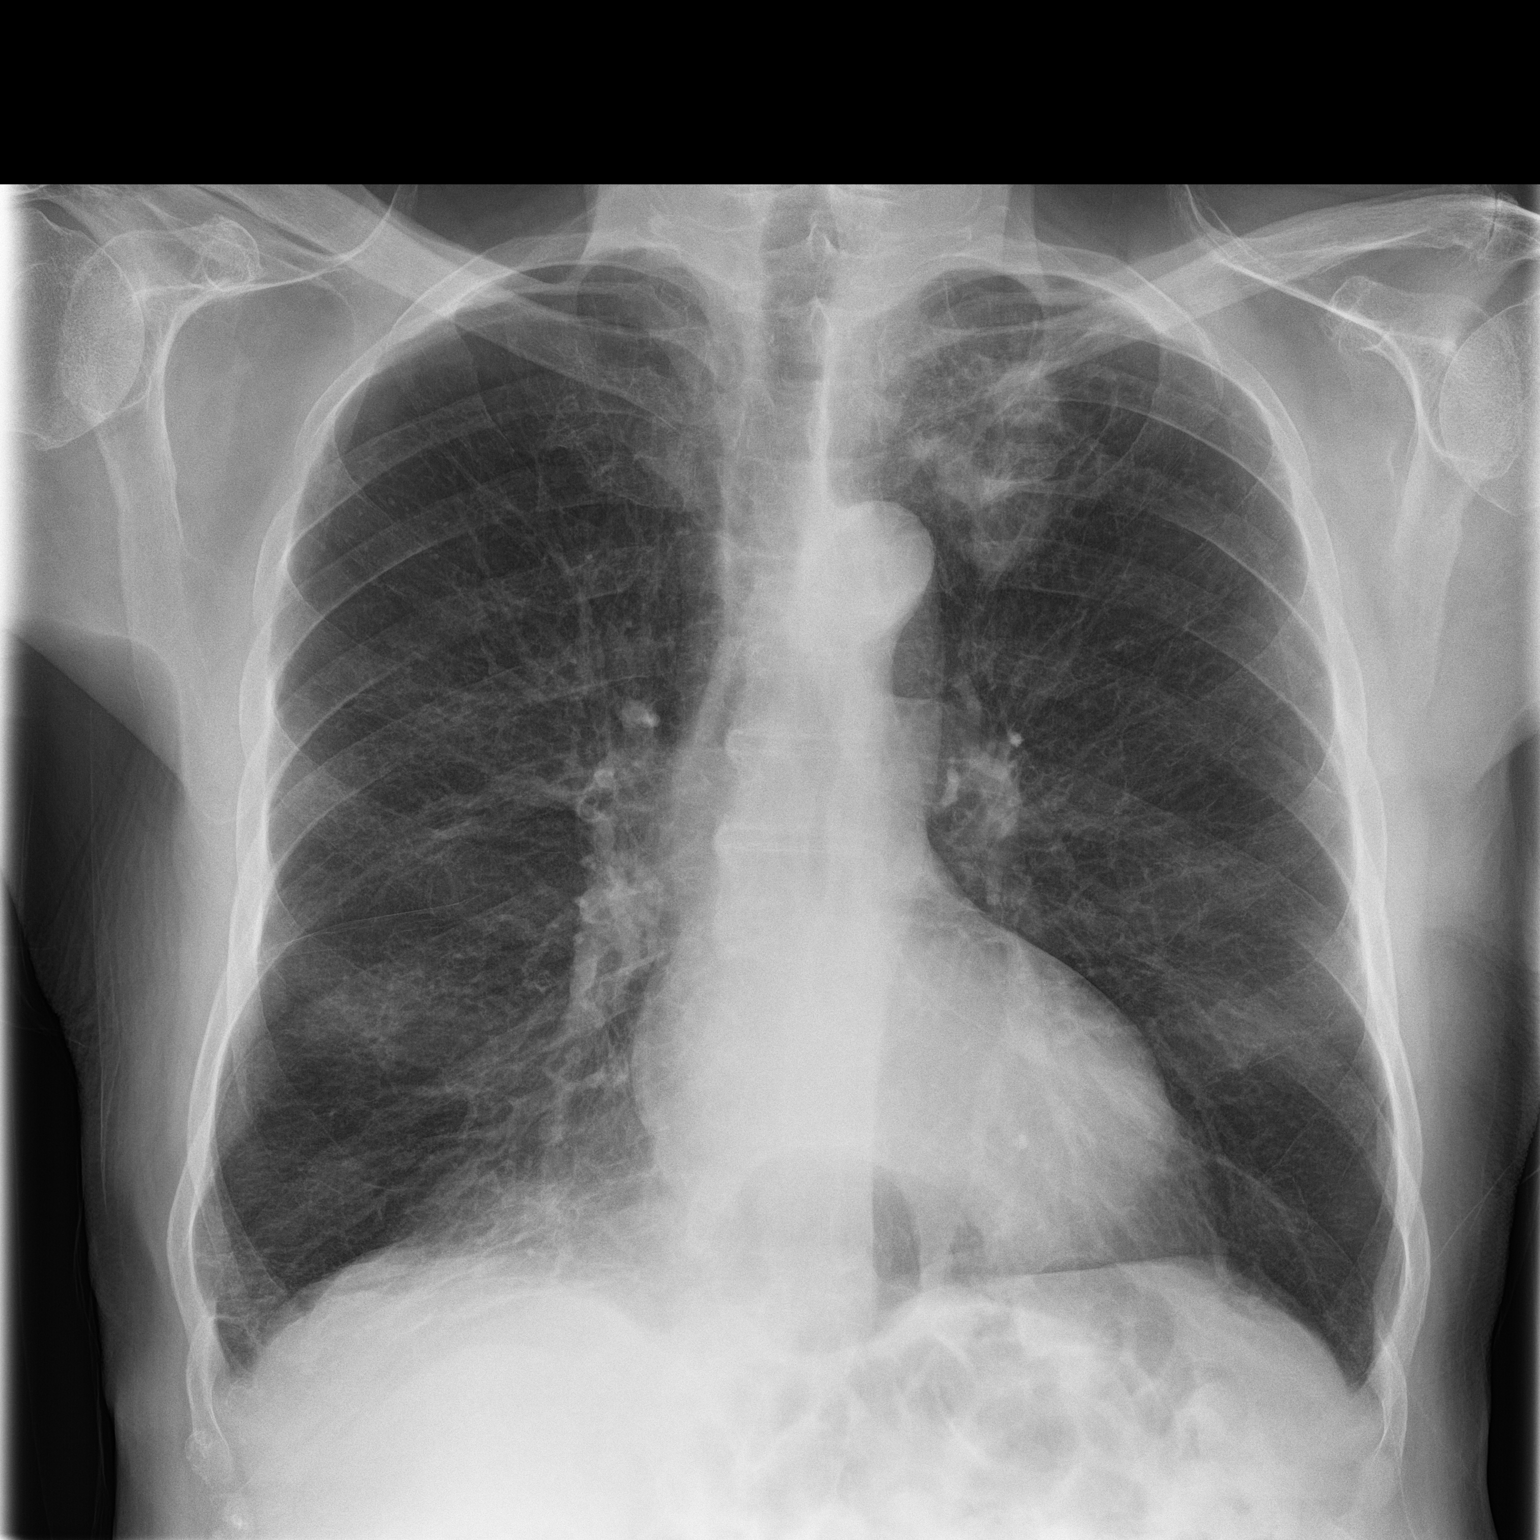
[im 2/2]
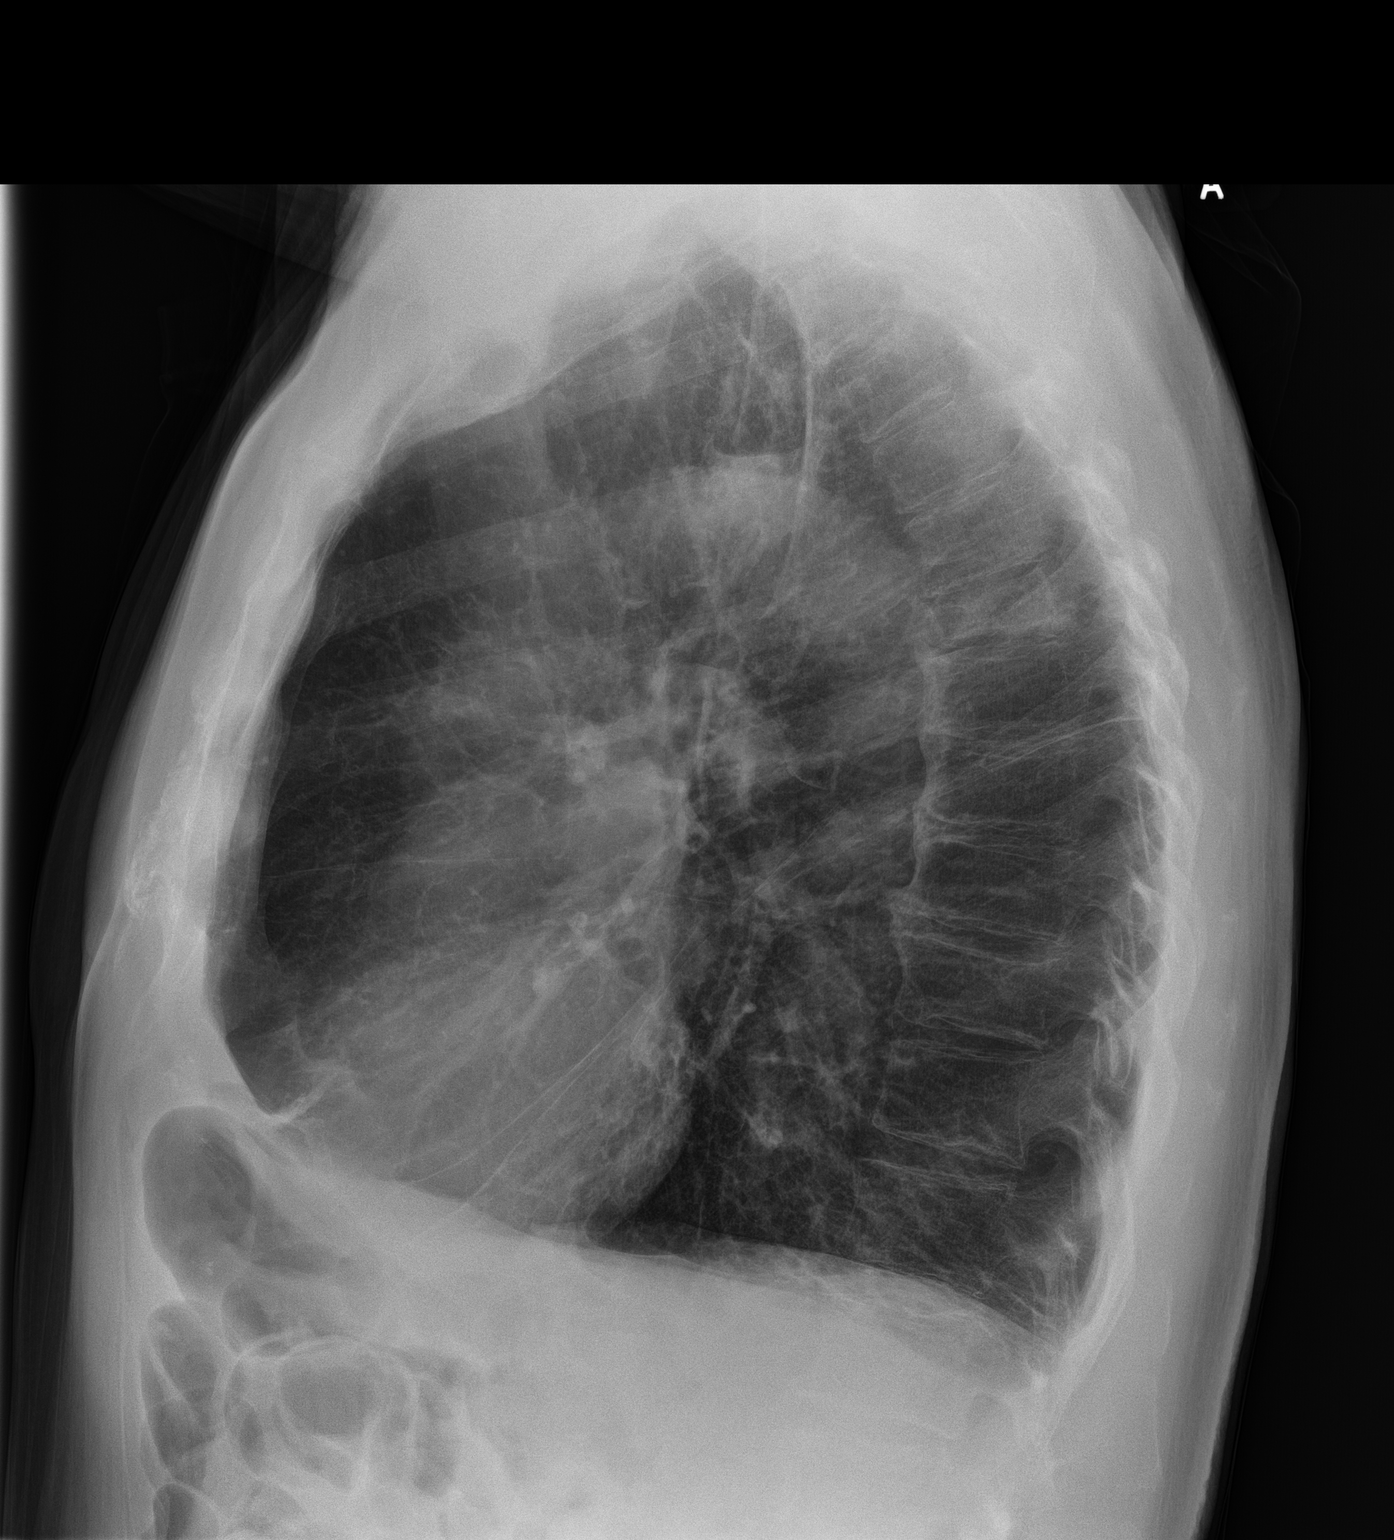

[2 of 2 positions shown; findings below may reference images not displayed]

FINDINGS: Cardiac shadow is stable. The known cavitary lesion in the left
upper lobe medially is again identified and stable. Hyperinflation
of lungs is seen consistent with COPD. Chronic changes in the
thoracic spine are noted
IMPRESSION: Stable cavitary lesion in the left lung. No new focal abnormality is
seen.

COPD.

## 2015-07-04 IMAGING — CR DG CHEST 1V PORT
1 series · 1 of 1 positions shown · non-contrast
Comparison: Radiographs 11/11/2014.  CT 11/18/2014.

CLINICAL DATA: Shortness of breath for 1 day. Oxygen dependent
COPD. Aspergillosis infection per previous CT. Subsequent encounter.

EXAM:
PORTABLE CHEST - 1 VIEW

[ap]
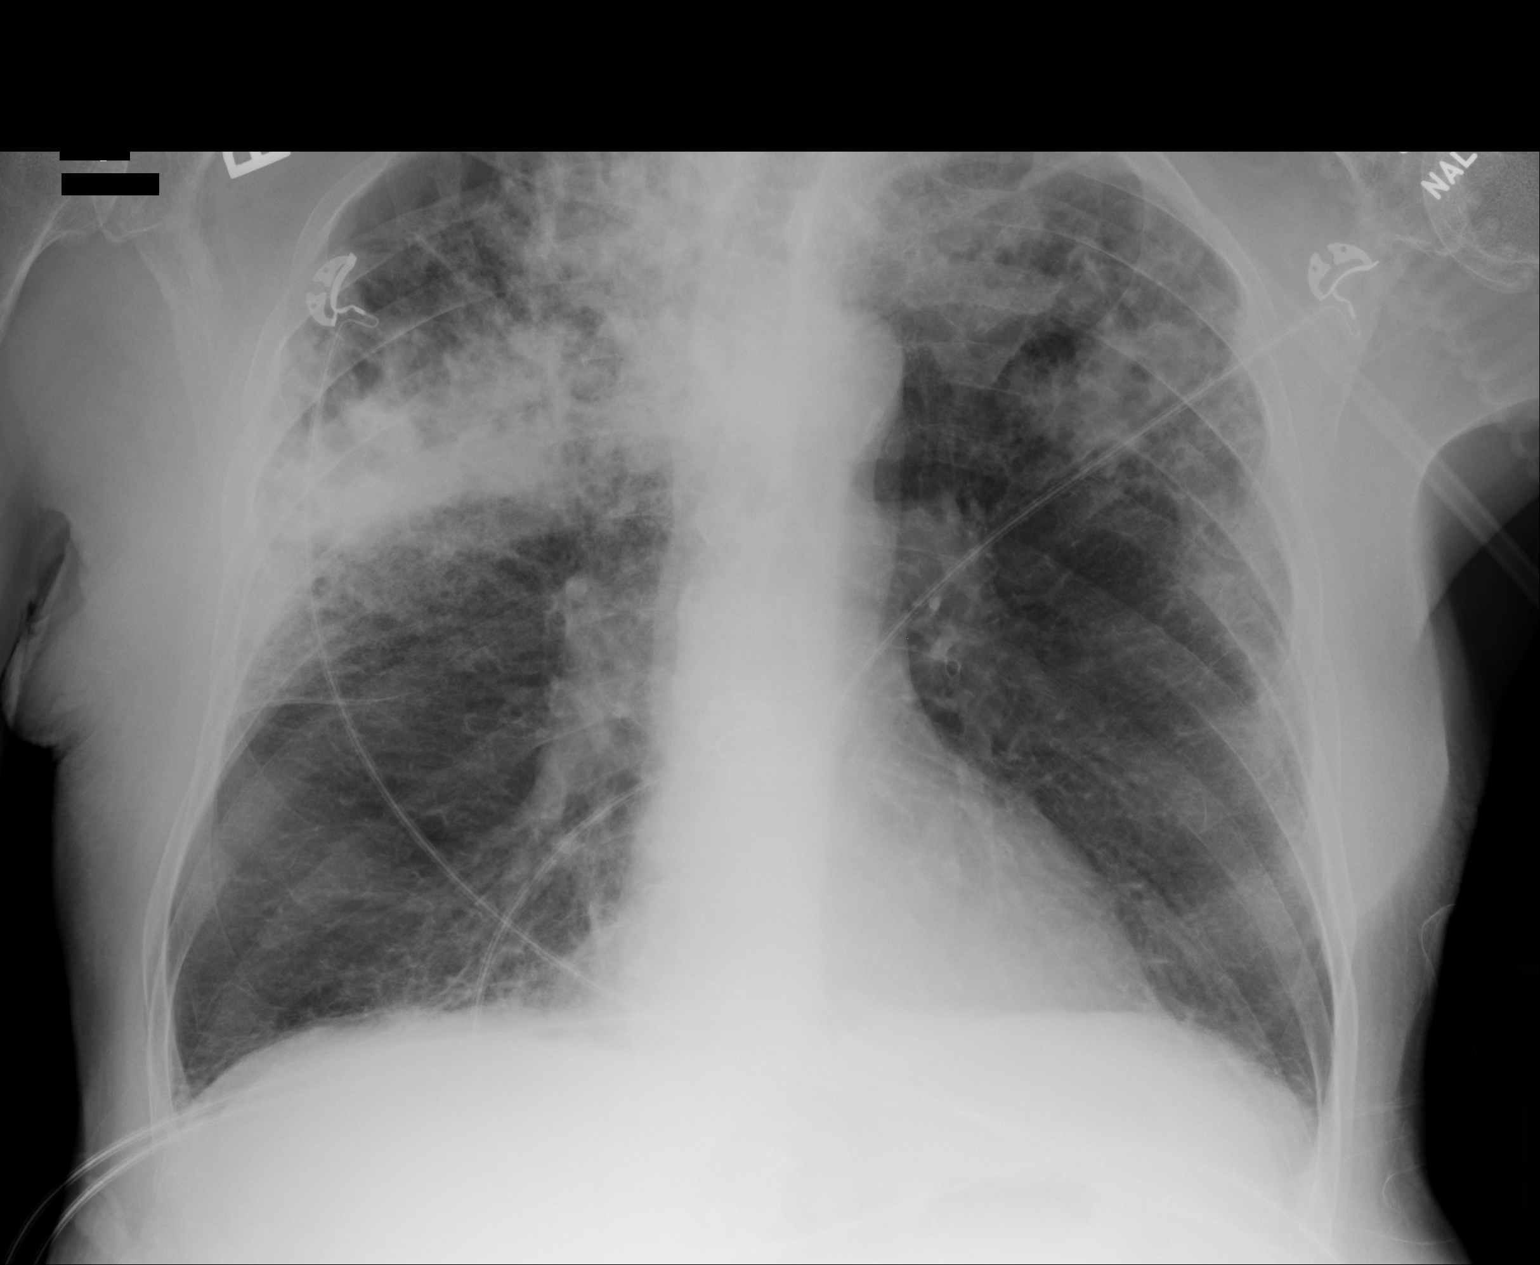

[1 of 1 positions shown; findings below may reference images not displayed]

FINDINGS: 9611 hours. There is progressive upper lobe airspace disease
bilaterally, more extensive on the right. Underlying cavitation at
the left apex noted on prior CT is not well visualized. The lung
bases appear unchanged. There is no pneumothorax or significant
pleural effusion. The heart size and mediastinal contours are
stable. Known T6 abnormality not well visualized.
IMPRESSION: Worsening bilateral airspace opacities, greater on the right.
Findings are consistent with worsening pneumonia and/or associated
hemorrhage.

## 2015-07-05 IMAGING — CR DG CHEST 1V PORT
1 series · 1 of 1 positions shown · non-contrast
Comparison: 11/25/2014

CLINICAL DATA: Subsequent evaluation for increased shortness of
breath, sepsis, history of pneumonia

EXAM:
PORTABLE CHEST - 1 VIEW

[ap]
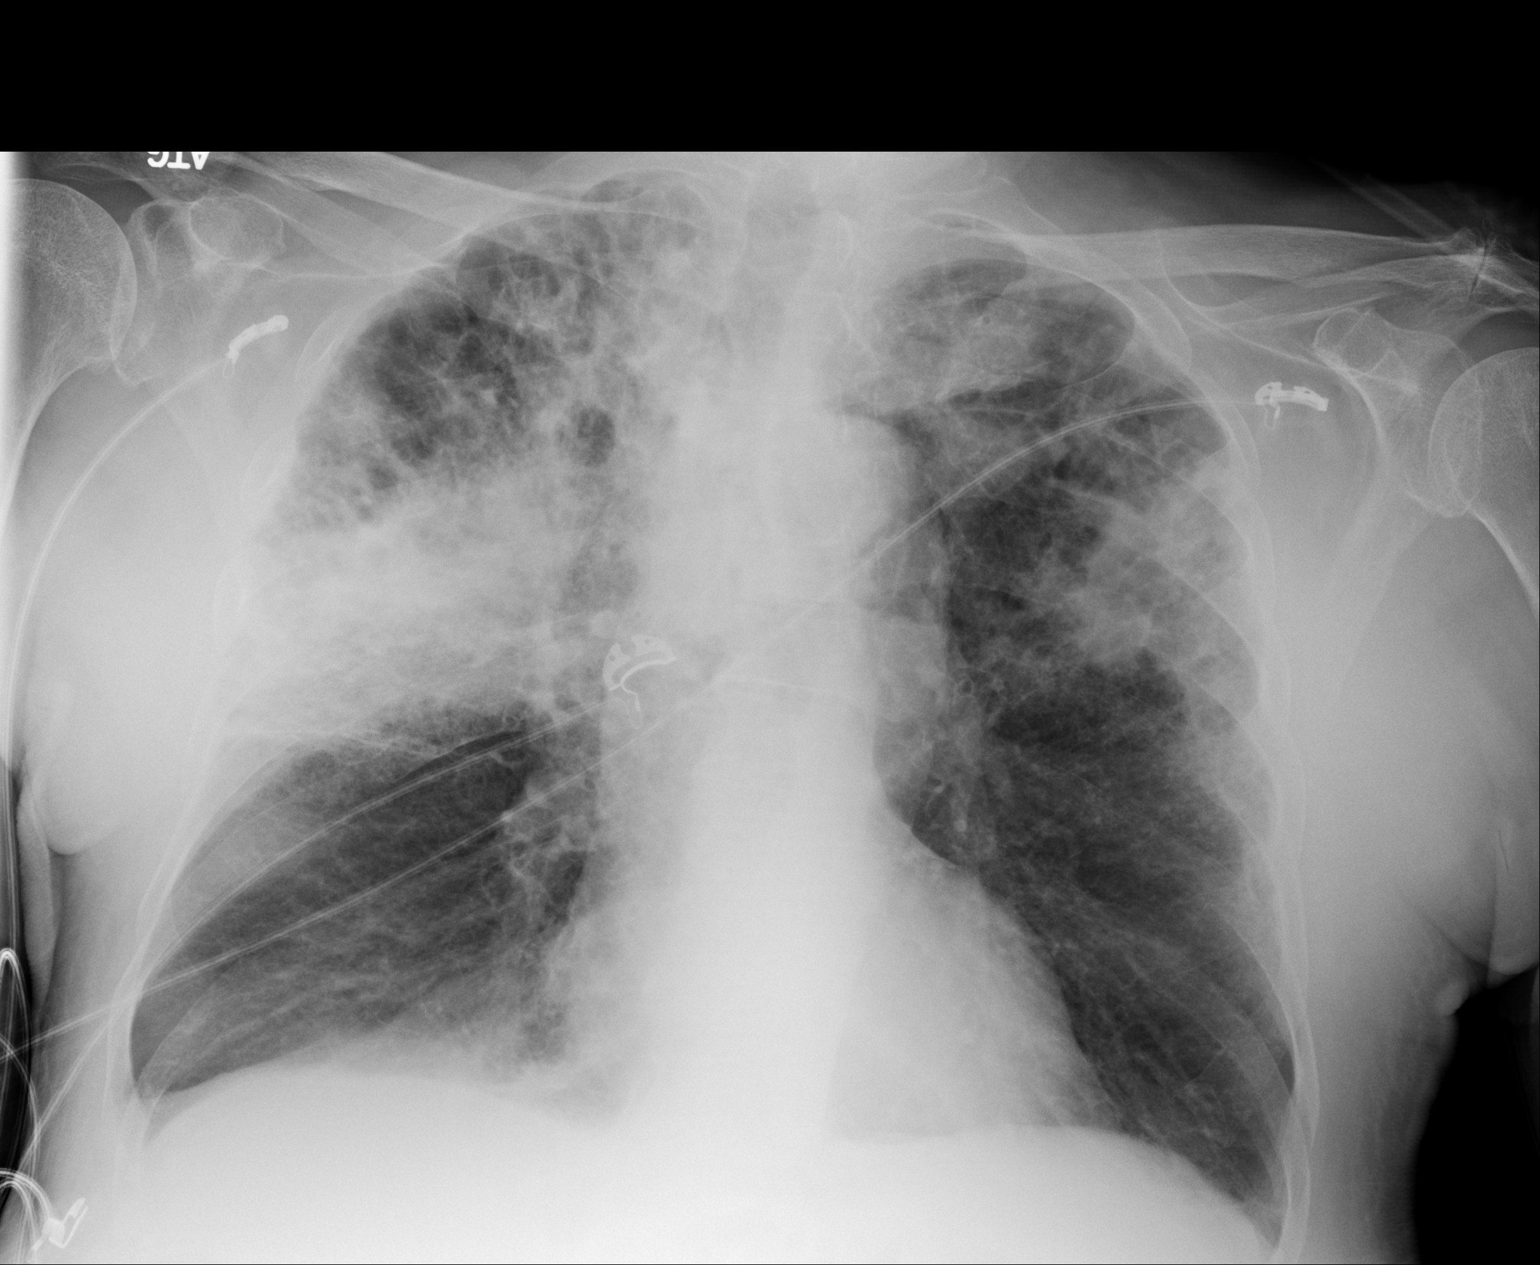

[1 of 1 positions shown; findings below may reference images not displayed]

FINDINGS: Heart size and vascular pattern are normal. Extensive opacity right
upper lobe with dense consolidation, increased in volume when
compared to prior study. Extensive but less voluminous left upper
lobe consolidation is unchanged from prior study.

There are at most trace pleural effusions.  Lower lobes are clear.
IMPRESSION: Significant multifocal bilateral upper lobe pneumonia with increased
in severity on the right.

## 2015-07-08 IMAGING — CR DG CHEST 1V PORT
1 series · 1 of 1 positions shown · non-contrast
Comparison: 11/26/2014

CLINICAL DATA: Pneumonia.

EXAM:
PORTABLE CHEST - 1 VIEW

[ap]
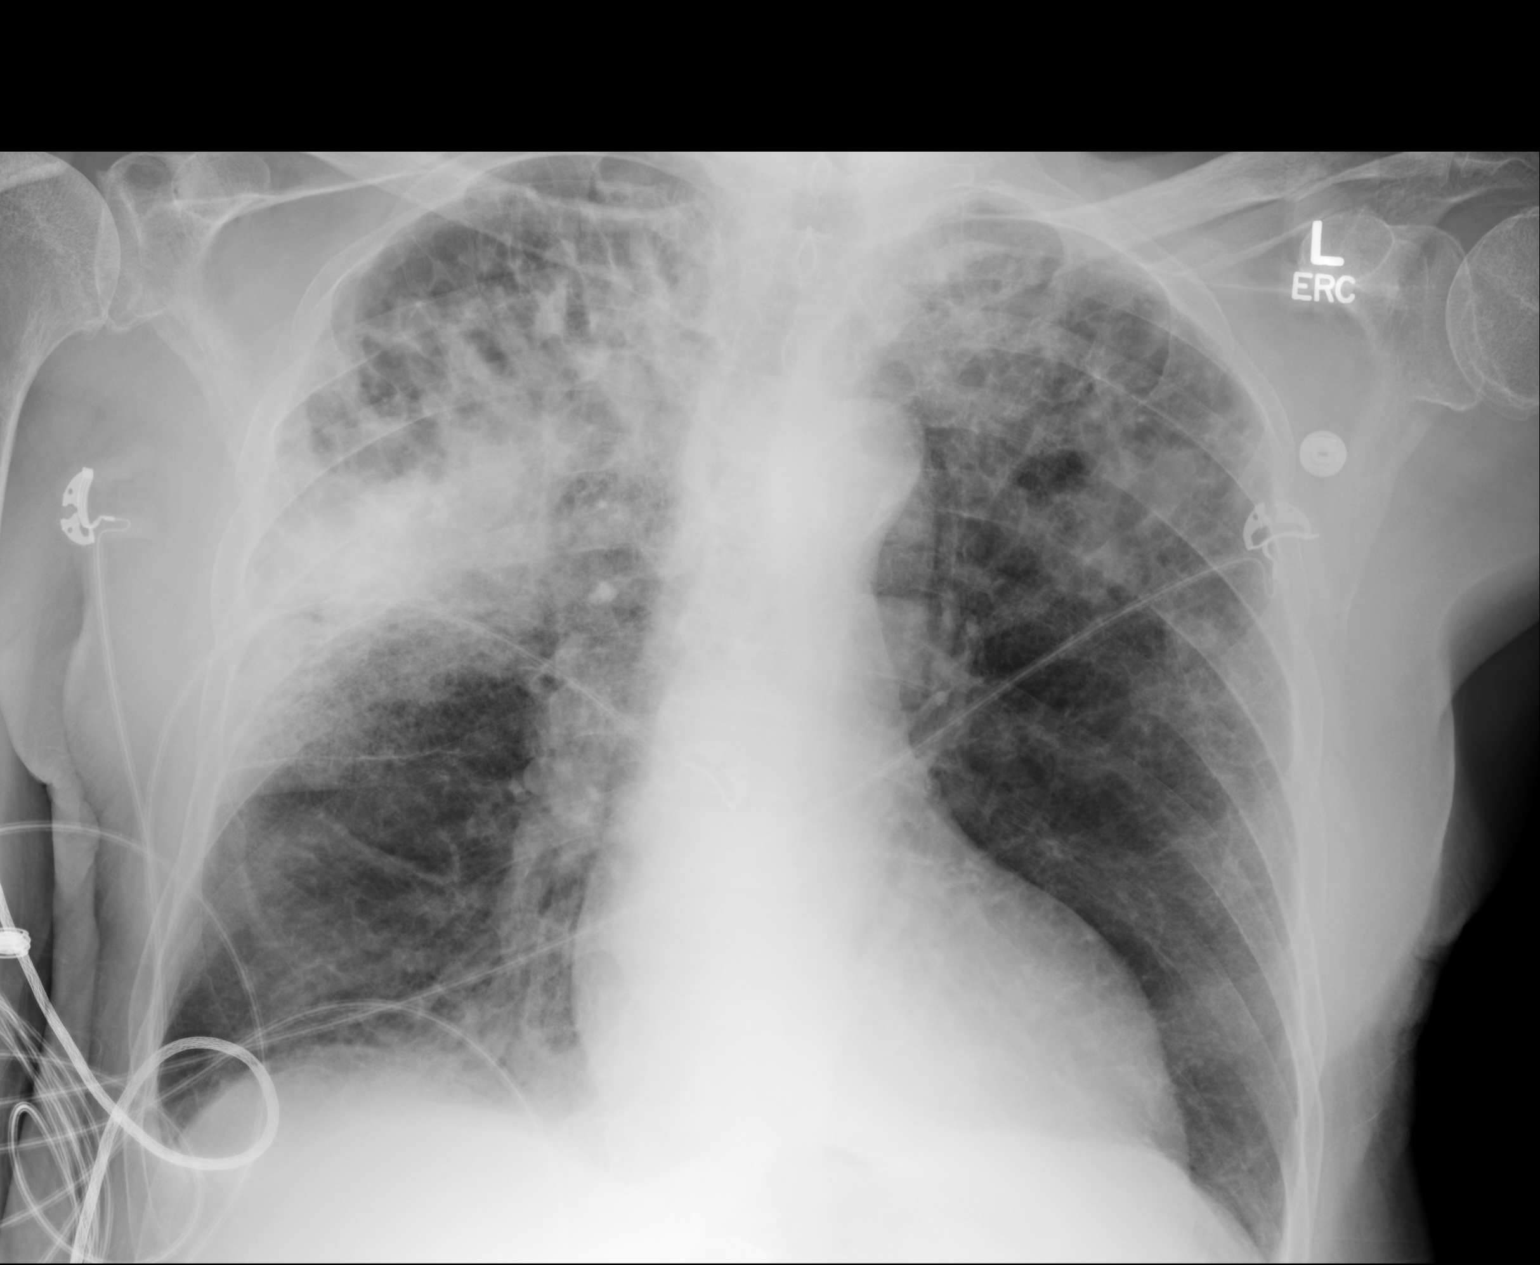

[1 of 1 positions shown; findings below may reference images not displayed]

FINDINGS: Midline trachea. Mild cardiomegaly. No pleural effusion or
pneumothorax. Minimal improvement in left upper lobe aeration.
Otherwise, similar right greater than left multi focal airspace
disease.
IMPRESSION: Minimal improvement in left upper lobe aeration. Otherwise similar
multifocal airspace disease, most consistent with infection.
